# Patient Record
Sex: Male | Born: 1970
Health system: Southern US, Community
[De-identification: ages and names within clinical notes are randomized; demographics above are authoritative.]

## PROBLEM LIST (undated history)

## (undated) DIAGNOSIS — F32A Depression, unspecified: Secondary | ICD-10-CM

## (undated) DIAGNOSIS — F419 Anxiety disorder, unspecified: Secondary | ICD-10-CM

## (undated) DIAGNOSIS — F329 Major depressive disorder, single episode, unspecified: Secondary | ICD-10-CM

## (undated) HISTORY — DX: Anxiety disorder, unspecified: F41.9

## (undated) HISTORY — PX: BACK SURGERY: SHX140

## (undated) HISTORY — DX: Depression, unspecified: F32.A

## (undated) HISTORY — DX: Major depressive disorder, single episode, unspecified: F32.9

---

## 2004-03-10 ENCOUNTER — Emergency Department: Payer: Self-pay | Admitting: Unknown Physician Specialty

## 2004-03-11 ENCOUNTER — Other Ambulatory Visit: Payer: Self-pay

## 2004-04-30 ENCOUNTER — Inpatient Hospital Stay: Payer: Self-pay | Admitting: Internal Medicine

## 2005-04-13 ENCOUNTER — Emergency Department: Payer: Self-pay | Admitting: Emergency Medicine

## 2005-04-22 ENCOUNTER — Other Ambulatory Visit: Payer: Self-pay

## 2005-04-22 ENCOUNTER — Emergency Department: Payer: Self-pay | Admitting: Emergency Medicine

## 2005-04-27 ENCOUNTER — Observation Stay: Payer: Self-pay | Admitting: Internal Medicine

## 2005-04-27 ENCOUNTER — Other Ambulatory Visit: Payer: Self-pay

## 2005-07-29 ENCOUNTER — Ambulatory Visit: Payer: Self-pay

## 2005-10-27 ENCOUNTER — Ambulatory Visit: Payer: Self-pay | Admitting: Pain Medicine

## 2005-11-04 ENCOUNTER — Ambulatory Visit: Payer: Self-pay | Admitting: Pain Medicine

## 2005-11-15 ENCOUNTER — Ambulatory Visit: Payer: Self-pay | Admitting: Pain Medicine

## 2005-11-23 ENCOUNTER — Ambulatory Visit: Payer: Self-pay | Admitting: Pain Medicine

## 2005-12-08 ENCOUNTER — Ambulatory Visit: Payer: Self-pay | Admitting: Physician Assistant

## 2006-08-13 ENCOUNTER — Emergency Department: Payer: Self-pay | Admitting: Emergency Medicine

## 2007-05-15 IMAGING — CT CT HEAD WITHOUT CONTRAST
2 series · 16 of 30 positions shown, 20 images · non-contrast
Comparison: none

REASON FOR EXAM: decreased responsiveness      rm 4
COMMENTS:

[Series 2: without · axial · non-contrast · 0.43mm/px · z∈[-175,-40]mm · 13 of 33 slices shown, 17 images]
[im 3/33  brain]
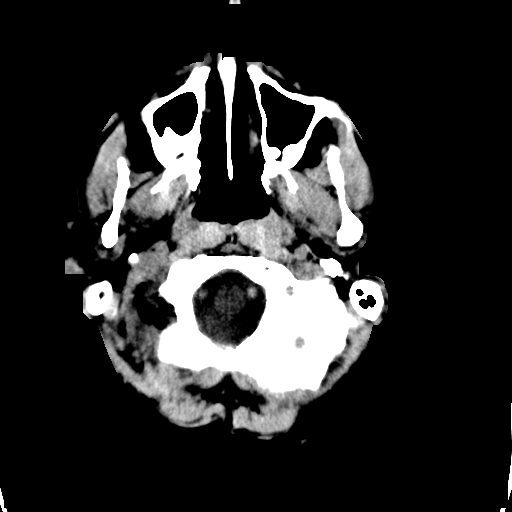
[im 3/33  bone]
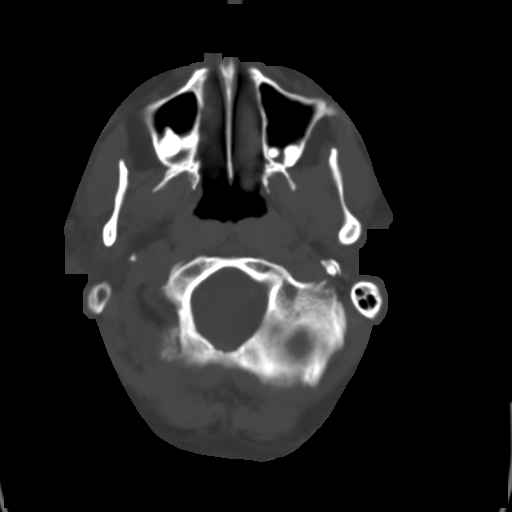
[im 5/33  brain]
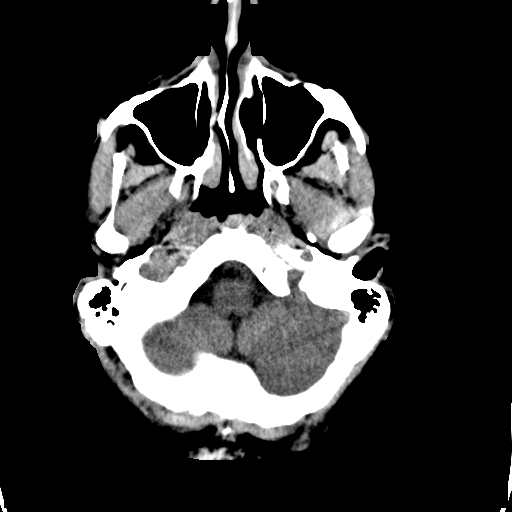
[im 7/33  brain]
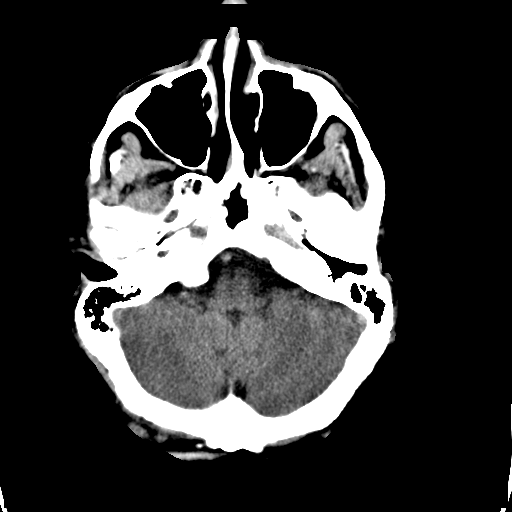
[im 10/33  brain]
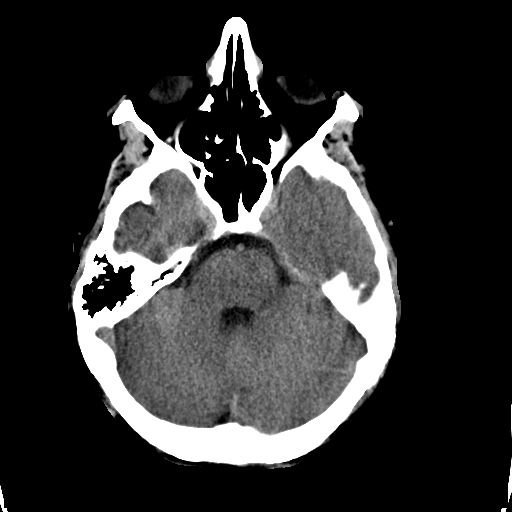
[im 12/33  brain]
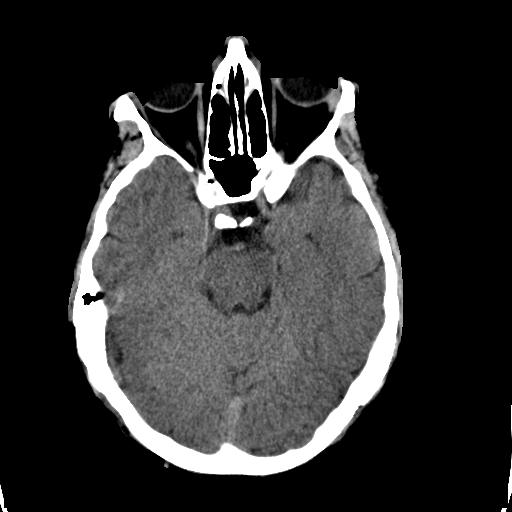
[im 12/33  bone]
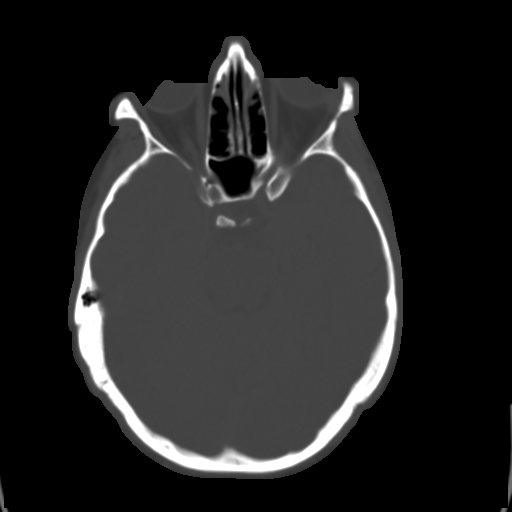
[im 14/33  brain]
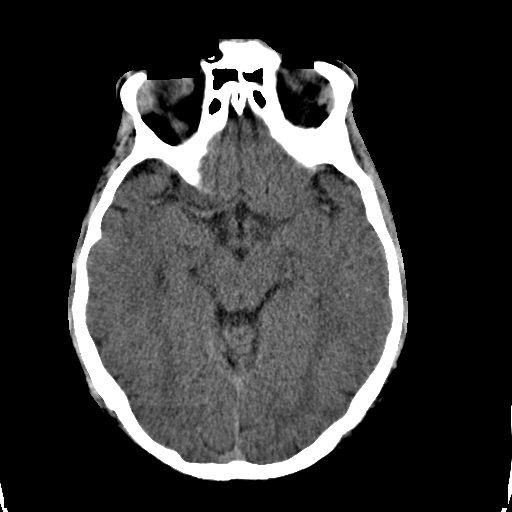
[im 17/33  brain]
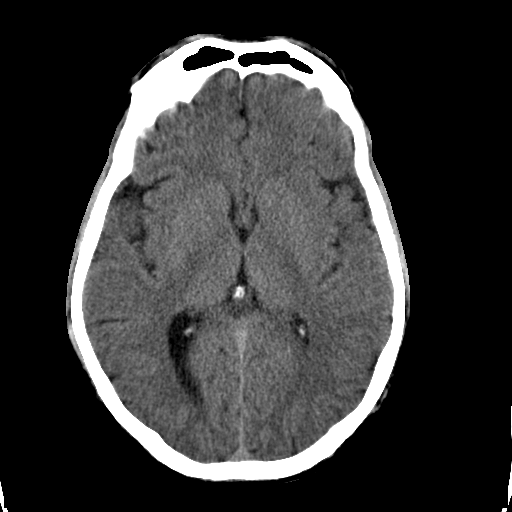
[im 19/33  brain]
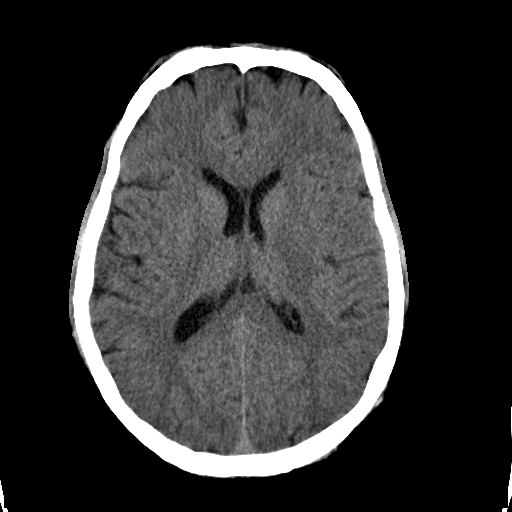
[im 21/33  brain]
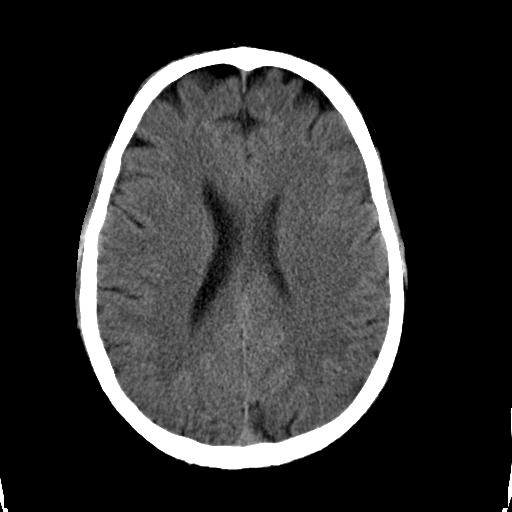
[im 21/33  bone]
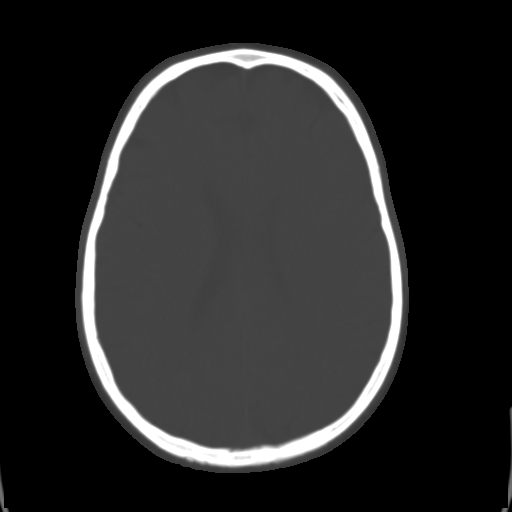
[im 23/33  brain]
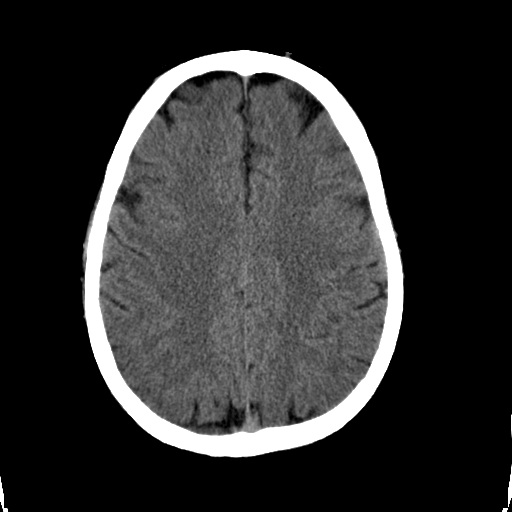
[im 26/33  brain]
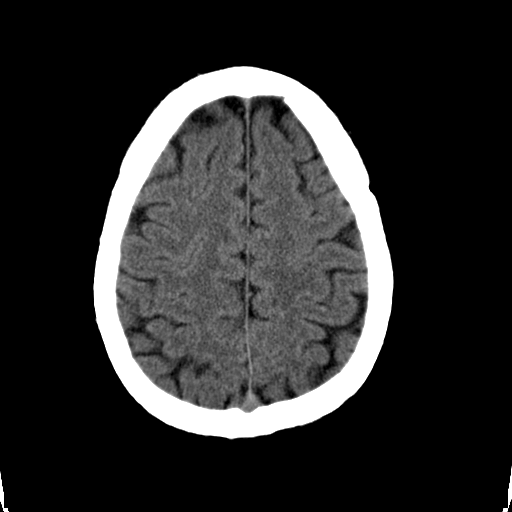
[im 28/33  brain]
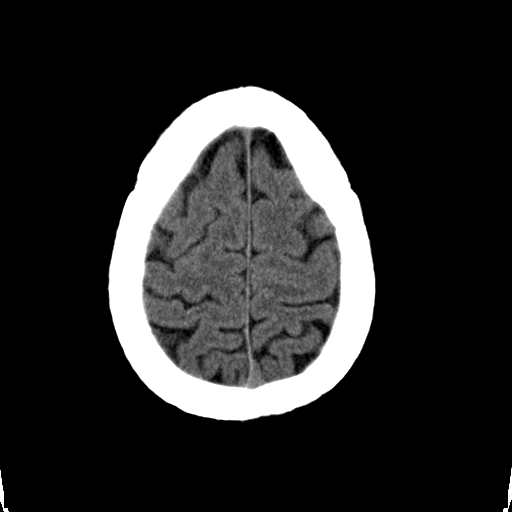
[im 30/33  brain]
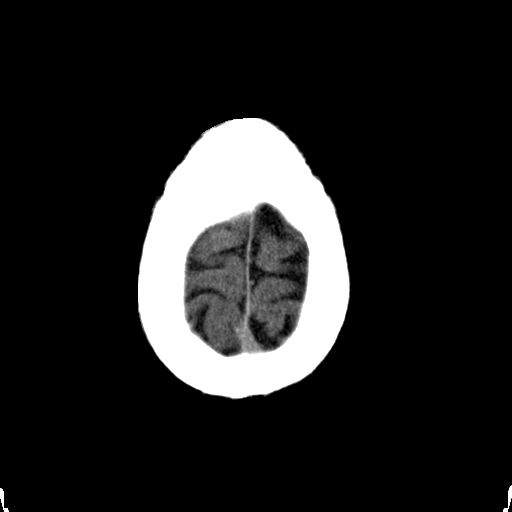
[im 30/33  bone]
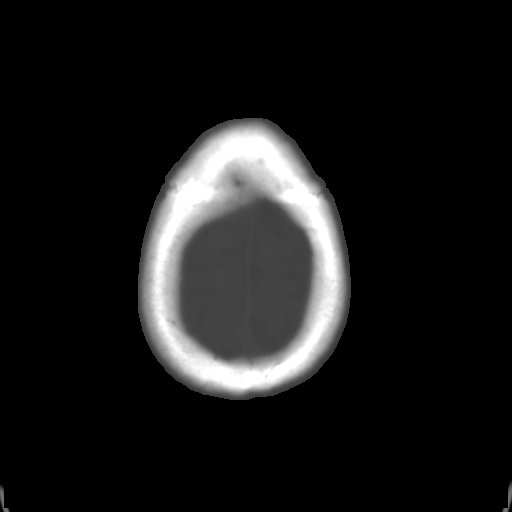

[Series 3: bone · axial · 0.43mm/px · z∈[-175,-130]mm · 3 of 33 slices shown]
[im 3/33  bone]
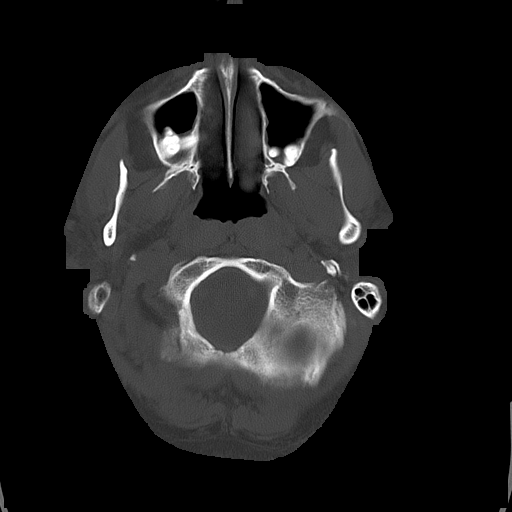
[im 7/33  bone]
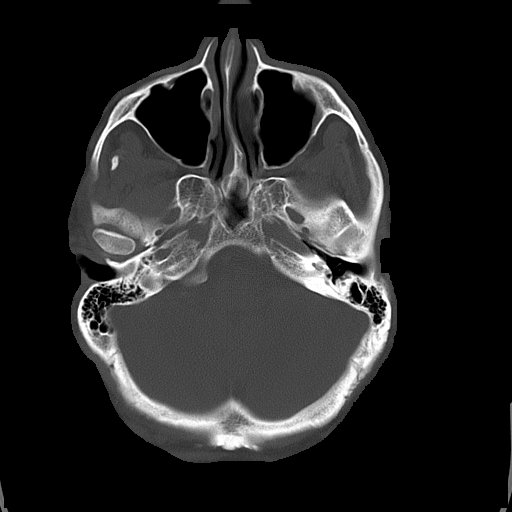
[im 12/33  bone]
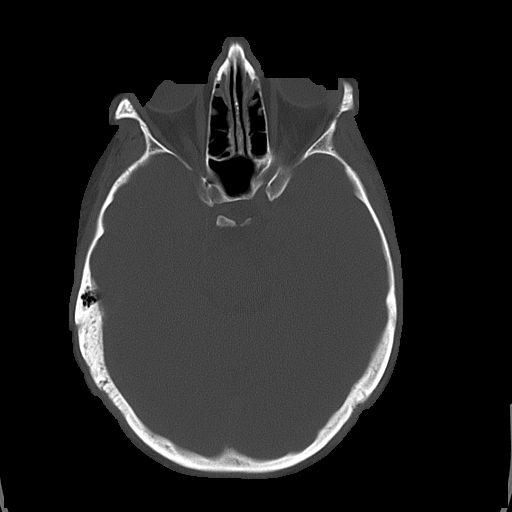

[16 of 30 positions shown; findings below may reference images not displayed]

PROCEDURE:     CT  - CT HEAD WITHOUT CONTRAST  - April 22, 2005 [DATE]

RESULT:     An unenhanced emergent Head CT was performed for decreased
responsiveness.  The report was initially faxed to the Emergency Department.

No intracerebral bleeds or infarcts are noted. No mass effect. No shift of
the midline. The ventricles appear within normal limits.  No extra-axial
fluid collections.  On the bone window settings the sinuses and mastoids
appear clear. There is some mucoperiosteal thickening of the LEFT sphenoid,
which can represent a LEFT sphenoid sinusitis.
IMPRESSION: 1)No acute abnormalities identified on the unenhanced Head CT.  Possible
chronic LEFT sphenoid sinusitis.

## 2007-05-20 IMAGING — US ABDOMEN ULTRASOUND
1 series · 17 of 25 positions shown · non-contrast
Comparison: none

REASON FOR EXAM: Abdominal pain, elevated bilirubin
COMMENTS:

[Series 1: abdomen ultrasound · 17 of 66 slices shown]
[im 1/66]
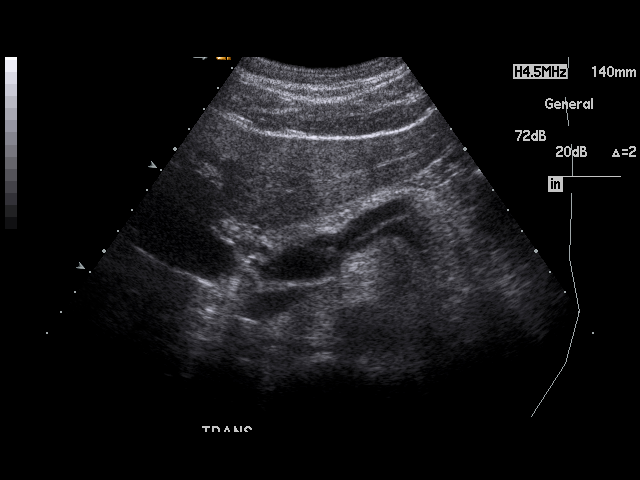
[im 6/66]
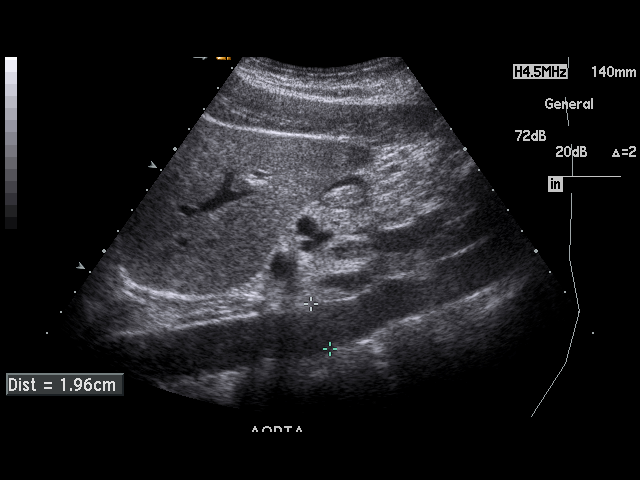
[im 9/66]
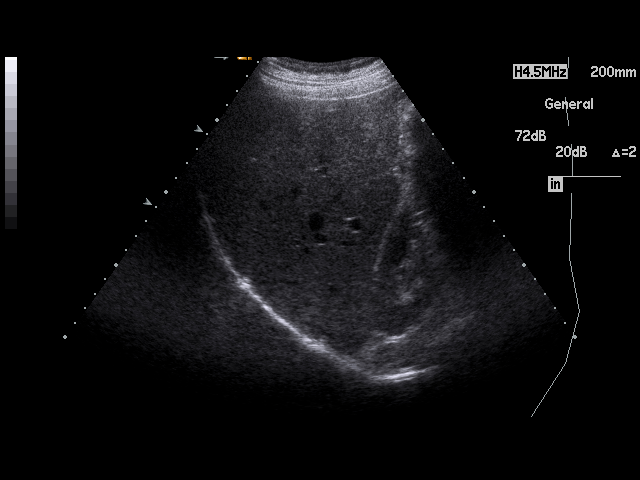
[im 14/66]
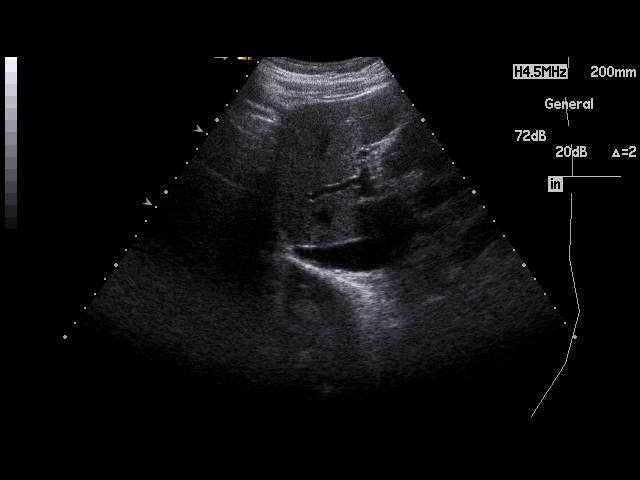
[im 17/66]
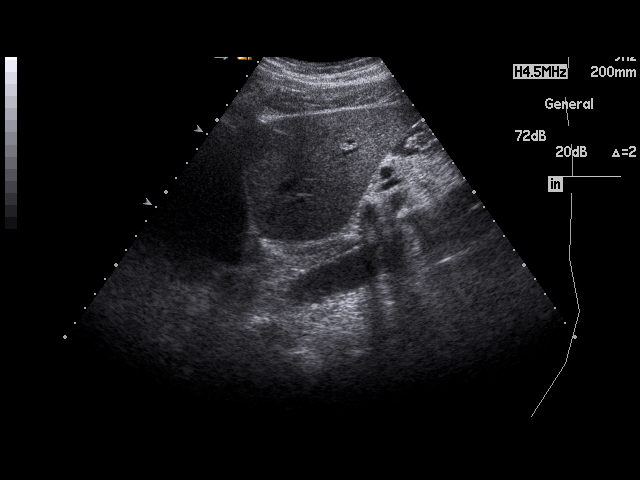
[im 22/66]
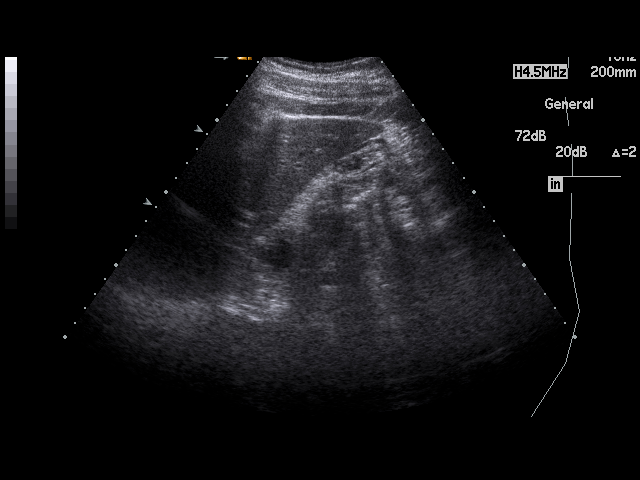
[im 25/66]
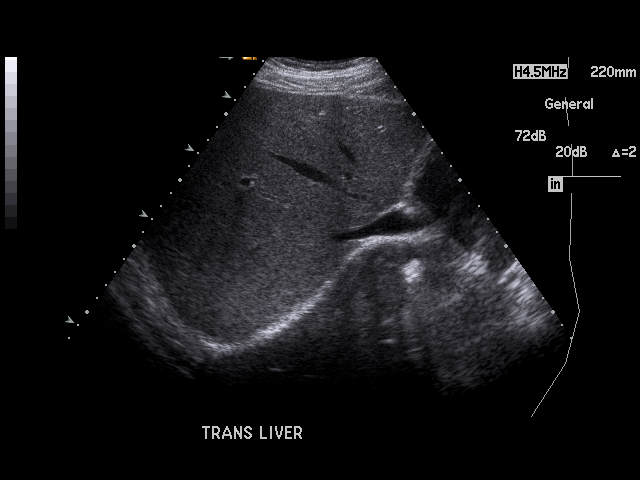
[im 30/66]
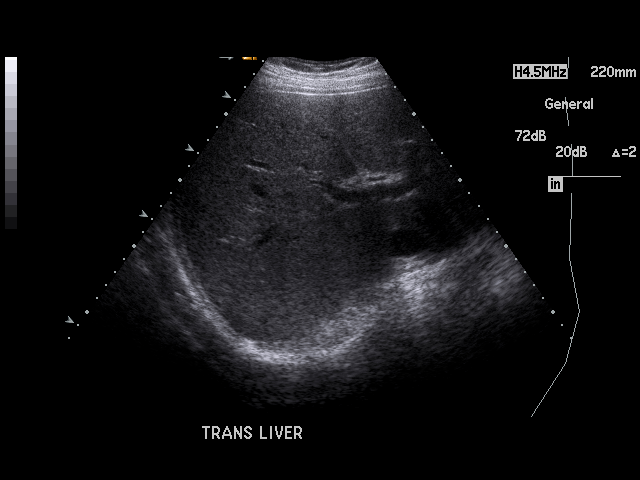
[im 33/66]
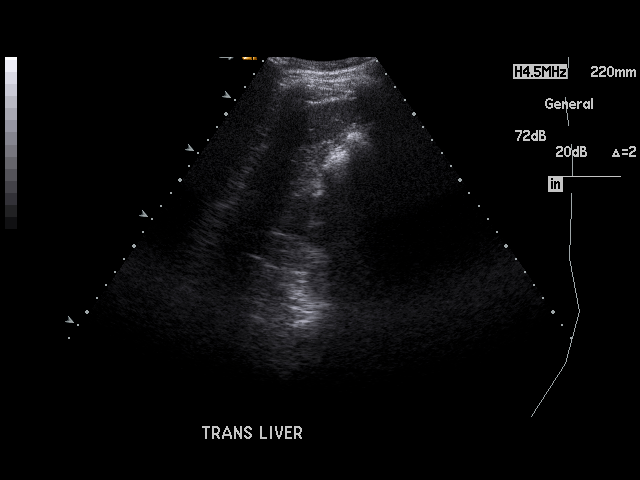
[im 36/66]
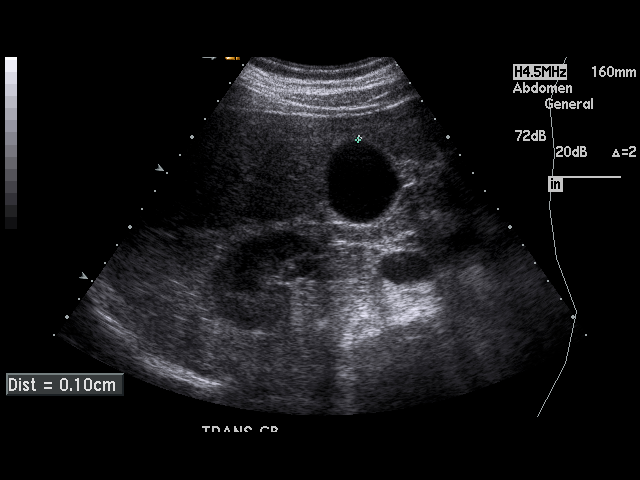
[im 41/66]
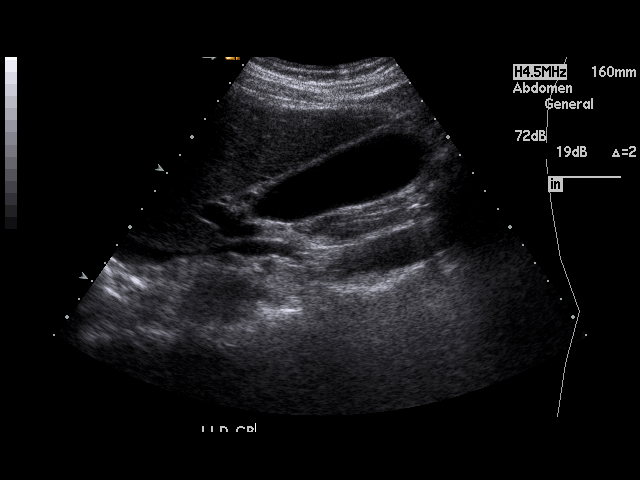
[im 44/66]
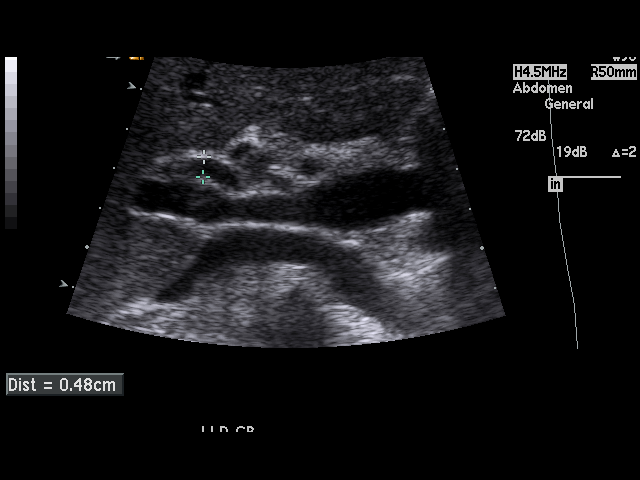
[im 49/66]
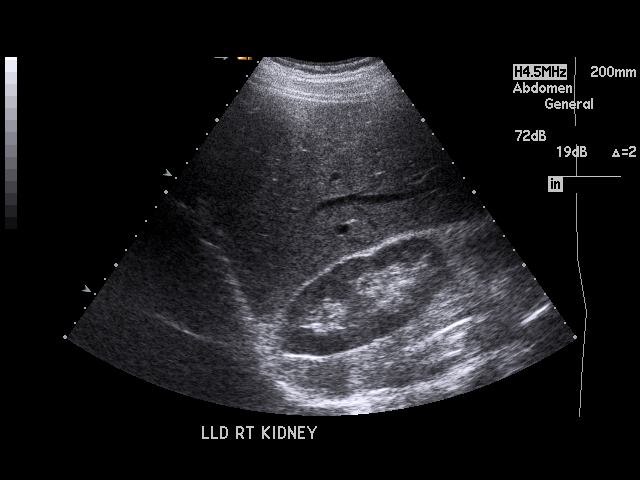
[im 52/66]
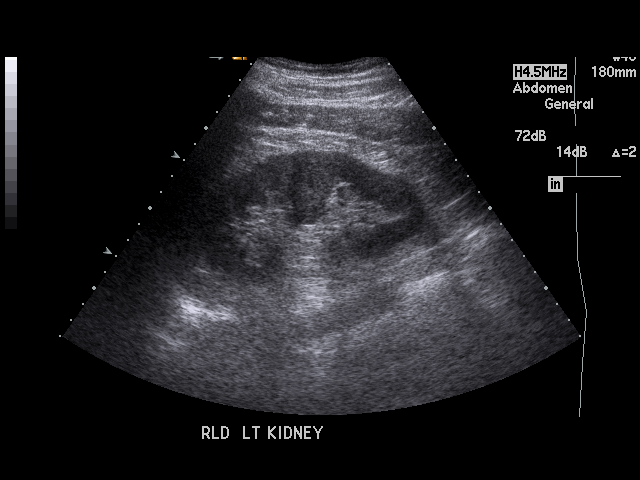
[im 57/66]
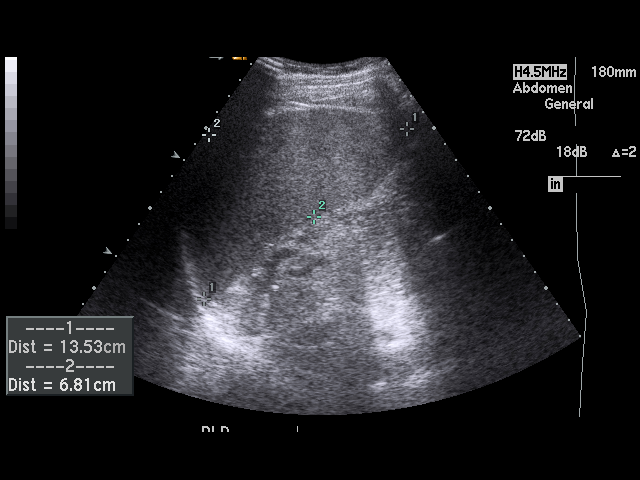
[im 60/66]
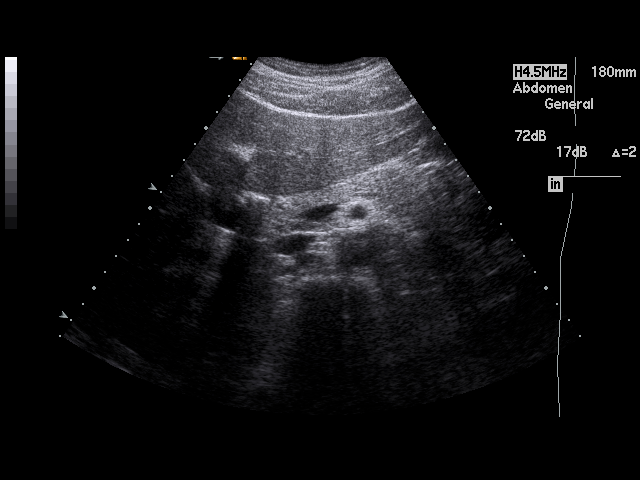
[im 66/66]
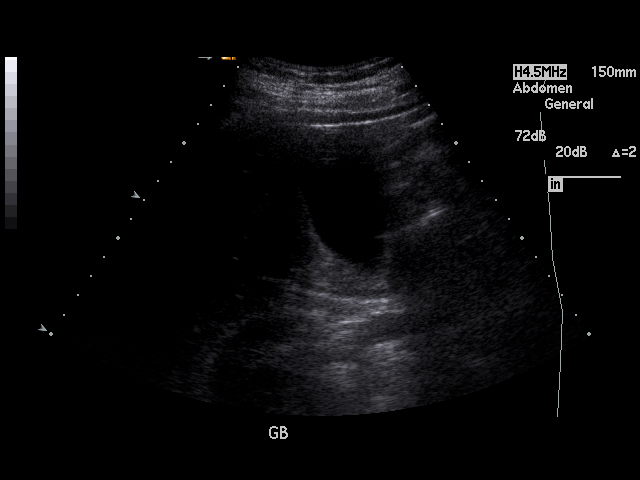

[17 of 25 positions shown; findings below may reference images not displayed]

PROCEDURE:     US  - US ABDOMEN GENERAL SURVEY  - April 27, 2005  [DATE]

RESULT:          Abdominal ultrasound is obtained and reveals a normal
liver, pancreas and gallbladder.  The abdominal aorta is normal.  The spleen
is borderline enlarged at 3.3 cm.  The RIGHT kidney measures 11.7 cm, the
LEFT 12.9.
IMPRESSION: Borderline splenomegaly at 3.3 cm.  Otherwise
unremarkable exam.

## 2007-05-20 IMAGING — CR DG CHEST 1V PORT
1 series · 2 of 2 positions shown · non-contrast
Comparison: none

REASON FOR EXAM: Rales at bases
COMMENTS:

PROCEDURE:     DXR - DXR PORTABLE CHEST SINGLE VIEW  - April 27, 2005  [DATE]
RESULT:     AP view of the chest shows the lung fields to be clear. No
pneumonia, pneumothorax or pleural effusion is seen. The heart size is
normal.

[Series 1: view not recorded · 0.17mm/px · 2 of 2 slices shown]
[im 1/2]
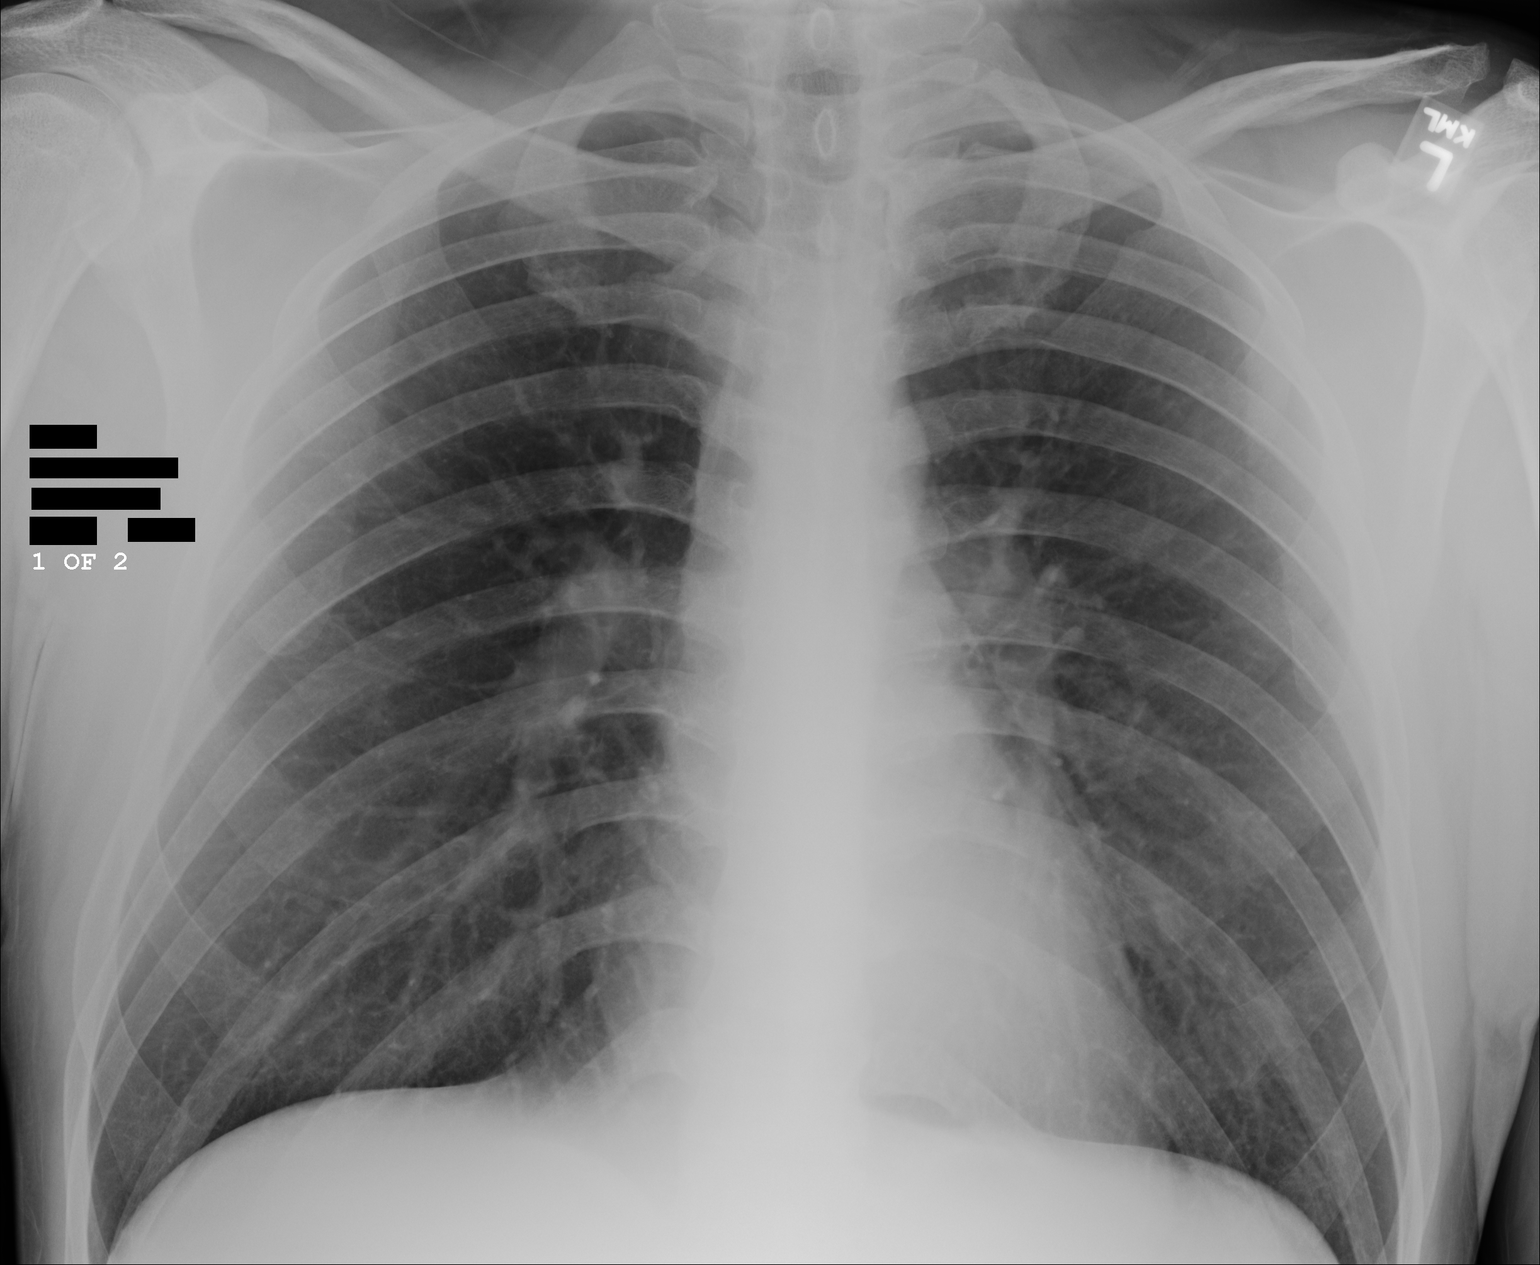
[im 2/2]
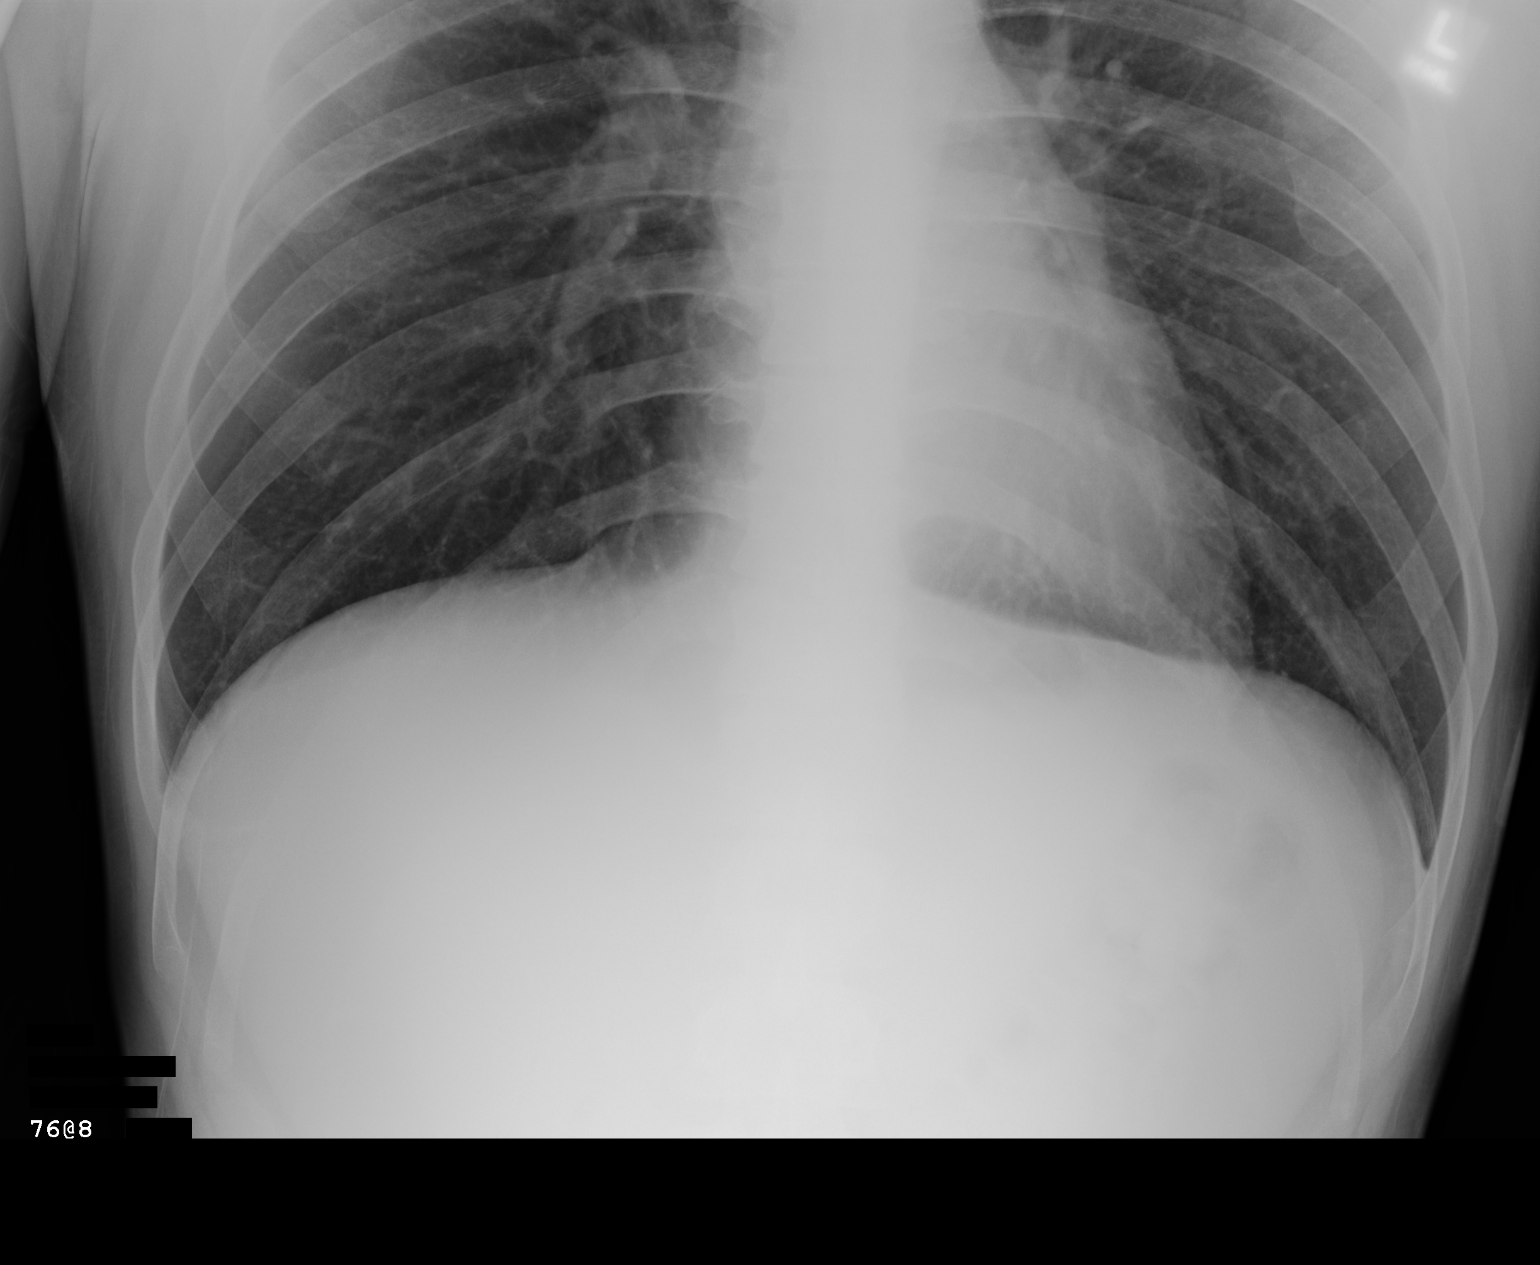

[2 of 2 positions shown; findings below may reference images not displayed]

IMPRESSION: No acute changes are identified.

## 2011-01-18 DIAGNOSIS — F332 Major depressive disorder, recurrent severe without psychotic features: Secondary | ICD-10-CM | POA: Diagnosis not present

## 2011-03-15 DIAGNOSIS — F332 Major depressive disorder, recurrent severe without psychotic features: Secondary | ICD-10-CM | POA: Diagnosis not present

## 2011-07-22 ENCOUNTER — Emergency Department: Payer: Self-pay | Admitting: Emergency Medicine

## 2011-07-22 DIAGNOSIS — D485 Neoplasm of uncertain behavior of skin: Secondary | ICD-10-CM | POA: Diagnosis not present

## 2011-07-22 DIAGNOSIS — D2370 Other benign neoplasm of skin of unspecified lower limb, including hip: Secondary | ICD-10-CM | POA: Diagnosis not present

## 2011-08-30 DIAGNOSIS — F332 Major depressive disorder, recurrent severe without psychotic features: Secondary | ICD-10-CM | POA: Diagnosis not present

## 2011-10-12 DIAGNOSIS — Z23 Encounter for immunization: Secondary | ICD-10-CM | POA: Diagnosis not present

## 2012-01-03 DIAGNOSIS — F332 Major depressive disorder, recurrent severe without psychotic features: Secondary | ICD-10-CM | POA: Diagnosis not present

## 2012-07-03 DIAGNOSIS — F332 Major depressive disorder, recurrent severe without psychotic features: Secondary | ICD-10-CM | POA: Diagnosis not present

## 2012-10-25 DIAGNOSIS — Z23 Encounter for immunization: Secondary | ICD-10-CM | POA: Diagnosis not present

## 2012-12-26 DIAGNOSIS — F332 Major depressive disorder, recurrent severe without psychotic features: Secondary | ICD-10-CM | POA: Diagnosis not present

## 2013-07-26 DIAGNOSIS — F332 Major depressive disorder, recurrent severe without psychotic features: Secondary | ICD-10-CM | POA: Diagnosis not present

## 2013-10-29 DIAGNOSIS — Z23 Encounter for immunization: Secondary | ICD-10-CM | POA: Diagnosis not present

## 2014-05-16 DIAGNOSIS — F332 Major depressive disorder, recurrent severe without psychotic features: Secondary | ICD-10-CM | POA: Insufficient documentation

## 2014-07-23 ENCOUNTER — Encounter: Payer: Self-pay | Admitting: Psychiatry

## 2014-07-23 ENCOUNTER — Ambulatory Visit (INDEPENDENT_AMBULATORY_CARE_PROVIDER_SITE_OTHER): Payer: Medicare Other | Admitting: Psychiatry

## 2014-07-23 VITALS — BP 122/82 | HR 83 | Temp 98.1°F | Ht 74.0 in | Wt 206.6 lb

## 2014-07-23 DIAGNOSIS — F333 Major depressive disorder, recurrent, severe with psychotic symptoms: Secondary | ICD-10-CM

## 2014-07-23 DIAGNOSIS — E7800 Pure hypercholesterolemia, unspecified: Secondary | ICD-10-CM

## 2014-07-23 MED ORDER — OLANZAPINE 15 MG PO TABS: 30.0000 mg | ORAL_TABLET | Freq: Every day | ORAL | Status: DC

## 2014-07-23 MED ORDER — LAMOTRIGINE 100 MG PO TABS: 100.0000 mg | ORAL_TABLET | Freq: Two times a day (BID) | ORAL | Status: DC

## 2014-07-23 NOTE — Progress Notes (Signed)
[facility] MD/PA/NP OP Progress Note  07/23/2014 3:53 PM Rodney Morrison  MRN:  [id number]  Subjective:  "I'm doing well now" Chief Complaint:  history of severe depression. Currently mood and anxiety level are good. No new complaints Visit Diagnosis:     ICD-9-CM ICD-10-CM   1. Elevated cholesterol 272.0 E78.0 Lipid panel  2. Severe recurrent major depression with psychotic features 296.34 F33.3     Past Medical History:  Past Medical History  Diagnosis Date  . Anxiety   . Depression     Past Surgical History  Procedure Laterality Date  . Back surgery     Family History: History reviewed. No pertinent family history. Social History:  History   Social History  . Marital Status: Single    Spouse Name: N/A  . Number of Children: N/A  . Years of Education: N/A   Social History Main Topics  . Smoking status: Current Every Day Smoker -- 1.00 packs/day    Types: Cigarettes    Start date: 07/22/1984  . Smokeless tobacco: Never Used  . Alcohol Use: 0.6 oz/week    0 Standard drinks or equivalent, 1 Cans of beer per week  . Drug Use: No  . Sexual Activity: Not Currently   Other Topics Concern  . None   Social History Narrative  . None   Additional History: Since our last visit he had an episode of once again injuring his leg which caused him to be out of commission for several weeks. During this time he got depressed and anxious but did not have any suicidal thoughts. He has continued his medicine without interruption and has no side effects from it. Mood is good. No psychotic symptoms. No suicidal ideation.  Assessment: Recurrent depression with psychotic features currently stable on medication Musculoskeletal: Strength & Muscle Tone: within normal limits Gait & Station: normal Patient leans: N/A  Psychiatric Specialty Exam: HPI  ROS  Blood pressure 122/82, pulse 83, temperature 98.1 F (36.7 C), temperature source Tympanic, height 6\' 2"  (1.88 m), weight 206 lb 9.6 oz (93.713  kg), SpO2 96 %.Body mass index is 26.51 kg/(m^2).  General Appearance: Casual  Eye Contact:  Good  Speech:  Clear and Coherent  Volume:  Normal  Mood:  Euthymic  Affect:  Appropriate  Thought Process:  Coherent  Orientation:  Full (Time, Place, and Person)  Thought Content:  Negative  Suicidal Thoughts:  No  Homicidal Thoughts:  No  Memory:  Immediate;   Good Recent;   Good Remote;   Good  Judgement:  Good  Insight:  Fair  Psychomotor Activity:  Normal  Concentration:  Good  Recall:  Good  Fund of Knowledge: Good  Language: Good  Akathisia:  No  Handed:  Right  AIMS (if indicated):     Assets:  Communication Skills Desire for Improvement Financial Resources/Insurance Resilience Social Support  ADL's:  Intact  Cognition: WNL  Sleep:  Good    Is the patient at risk to self?  No. Has the patient been a risk to self in the past 6 months?  No. Has the patient been a risk to self within the distant past?  No. Is the patient a risk to others?  No. Has the patient been a risk to others in the past 6 months?  No. Has the patient been a risk to others within the distant past?  No.  Current Medications: Current Outpatient Prescriptions  Medication Sig Dispense Refill  . lamoTRIgine (LAMICTAL) 100 MG tablet Take 1  tablet (100 mg total) by mouth 2 (two) times daily. 60 tablet 5  . OLANZapine (ZYPREXA) 15 MG tablet Take 2 tablets (30 mg total) by mouth at bedtime. 60 tablet 5   No current facility-administered medications for this visit.    Medical Decision Making:  Established Problem, Stable/Improving (1), Review of Psycho-Social Stressors (1), Review and summation of old records (2), Review of Medication Regimen & Side Effects (2) and Review of New Medication or Change in Dosage (2)  Treatment Plan Summary:Medication management and Plan Continue Zyprexa and lamotrigine he did not get his lipid panel done last time. We discussed the rationale for this and the importance of  completing it. He agrees to the plan and will get his lipid panel checked and I have resubmitted the order to [facility]. Continue lamotrigine and Zyprexa. Follow-up 6 months. Supportive counseling and education completed   Alethia Berthold 07/23/2014, 3:53 PM

## 2014-11-15 DIAGNOSIS — Z23 Encounter for immunization: Secondary | ICD-10-CM | POA: Diagnosis not present

## 2015-01-23 ENCOUNTER — Encounter: Payer: Self-pay | Admitting: Psychiatry

## 2015-01-23 ENCOUNTER — Ambulatory Visit (INDEPENDENT_AMBULATORY_CARE_PROVIDER_SITE_OTHER): Payer: Medicare Other | Admitting: Psychiatry

## 2015-01-23 VITALS — BP 138/90 | HR 94 | Temp 98.3°F | Ht 74.0 in | Wt 210.8 lb

## 2015-01-23 DIAGNOSIS — F333 Major depressive disorder, recurrent, severe with psychotic symptoms: Secondary | ICD-10-CM

## 2015-01-23 MED ORDER — OLANZAPINE 15 MG PO TABS: 30.0000 mg | ORAL_TABLET | Freq: Every day | ORAL | Status: DC

## 2015-01-23 MED ORDER — LAMOTRIGINE 100 MG PO TABS: 100.0000 mg | ORAL_TABLET | Freq: Two times a day (BID) | ORAL | Status: DC

## 2015-03-14 NOTE — Progress Notes (Signed)
[facility] MD Progress Note  03/14/2015 4:57 PM Rodney Morrison  MRN:  [id number] Subjective:  Follow-up for 45 year old man with a history of severe psychotic depression. He is stable as long as he takes his antipsychotic and mood stabilizer. He has had some rough times recently and some stress in his family but is feeling better. Never got into a bad depression or had psychotic symptoms. Feeling physically better now. Tolerating medicine well. Optimistic about the future. Principal Problem: @PPROB @ Diagnosis:   Patient Active Problem List   Diagnosis Date Noted  . Major depressive disorder, recurrent severe without psychotic features ([place]) [F33.2] 05/16/2014   Total Time spent with patient: 20 minutes  Past Psychiatric History: Past history of severe depression with psychotic symptoms especially when off medicine very sensitive.  Past Medical History:  Past Medical History  Diagnosis Date  . Anxiety   . Depression     Past Surgical History  Procedure Laterality Date  . Back surgery     Family History: History reviewed. No pertinent family history. Family Psychiatric  History: Positive for anxiety and depression Social History:  History  Alcohol Use  . 0.6 oz/week  . 0 Standard drinks or equivalent, 1 Cans of beer per week     History  Drug Use No    Social History   Social History  . Marital Status: Single    Spouse Name: N/A  . Number of Children: N/A  . Years of Education: N/A   Social History Main Topics  . Smoking status: Current Every Day Smoker -- 1.00 packs/day    Types: Cigarettes    Start date: 07/22/1984  . Smokeless tobacco: Never Used  . Alcohol Use: 0.6 oz/week    0 Standard drinks or equivalent, 1 Cans of beer per week  . Drug Use: No  . Sexual Activity: Not Currently   Other Topics Concern  . None   Social History Narrative   Additional Social History:                         Sleep: Fair  Appetite:  Fair  Current Medications: Current  Outpatient Prescriptions  Medication Sig Dispense Refill  . lamoTRIgine (LAMICTAL) 100 MG tablet Take 1 tablet (100 mg total) by mouth 2 (two) times daily. 60 tablet 5  . OLANZapine (ZYPREXA) 15 MG tablet Take 2 tablets (30 mg total) by mouth at bedtime. 60 tablet 5   No current facility-administered medications for this visit.    Lab Results: No results found for this or any previous visit (from the past 48 hour(s)).  Blood Alcohol level:  No results found for: [facility]  Physical Findings: AIMS:  , ,  ,  ,    CIWA:    COWS:     Musculoskeletal: Strength & Muscle Tone: within normal limits Gait & Station: normal Patient leans: N/A  Psychiatric Specialty Exam: ROS  Blood pressure 138/90, pulse 94, temperature 98.3 F (36.8 C), temperature source Tympanic, height 6\' 2"  (1.88 m), weight 210 lb 12.8 oz (95.618 kg), SpO2 96 %.Body mass index is 27.05 kg/(m^2).  General Appearance: Fairly Groomed  Engineer, water::  Good  Speech:  Clear and Coherent  Volume:  Normal  Mood:  Euthymic  Affect:  Congruent  Thought Process:  Intact  Orientation:  Full (Time, Place, and Person)  Thought Content:  Negative  Suicidal Thoughts:  No  Homicidal Thoughts:  No  Memory:  Immediate;   Good Recent;  Good Remote;   Good  Judgement:  Good  Insight:  Good  Psychomotor Activity:  Normal  Concentration:  Good  Recall:  Good  Fund of Knowledge:Good  Language: Good  Akathisia:  No  Handed:  Right  AIMS (if indicated):     Assets:  Desire for Improvement Financial Resources/Insurance Resilience Social Support  ADL's:  Intact  Cognition: WNL  Sleep:      Treatment Plan Summary: Medication management and Plan Continue lamotrigine and Zyprexa. Side effects and risks reviewed. Patient agreeable to continuing medicine. Orders done for 6 months. Patient will follow-up at that time he agrees to the plan.  Rodney Berthold, MD 03/14/2015, 4:57 PM

## 2015-07-22 ENCOUNTER — Ambulatory Visit (INDEPENDENT_AMBULATORY_CARE_PROVIDER_SITE_OTHER): Payer: Medicare Other | Admitting: Psychiatry

## 2015-07-22 DIAGNOSIS — F333 Major depressive disorder, recurrent, severe with psychotic symptoms: Secondary | ICD-10-CM

## 2015-07-22 DIAGNOSIS — G4700 Insomnia, unspecified: Secondary | ICD-10-CM | POA: Diagnosis not present

## 2015-07-22 MED ORDER — OLANZAPINE 15 MG PO TABS: 30.0000 mg | ORAL_TABLET | Freq: Every day | ORAL | Status: DC

## 2015-07-22 MED ORDER — LAMOTRIGINE 100 MG PO TABS: 100.0000 mg | ORAL_TABLET | Freq: Two times a day (BID) | ORAL | Status: DC

## 2015-07-22 MED ORDER — TEMAZEPAM 15 MG PO CAPS: 15.0000 mg | ORAL_CAPSULE | Freq: Every evening | ORAL | Status: DC | PRN

## 2015-07-22 NOTE — Progress Notes (Signed)
Patient ID: Rodney Morrison, male   DOB: 06-06-1970, 45 y.o.   MRN: [id number] Follow-up patient with severe recurrent depression with psychotic features. Mood has been stable. No return of any depression. No suicidal thoughts or behavior. No return of any hallucinations or psychotic symptoms. He does have poor sleep with nightmares about 3 times a week.  On full review of systems he has trouble with sleep. No other new specific physical complaints. No mood complaints no psychotic symptoms.  On mental status he is neatly dressed, alert and oriented, appropriate in his interactions. Affect is euthymic and reactive. Mood is good. Thoughts lucid without loosening of associations or delusions.  Tolerating Lamictal 100 mg twice a day and olanzapine 30 mg at night. Labs of been stable.  Add Restoril 15 mg at night as needed only for sleep. Suggest he uses it only if he wakes up from nightmares. Follow-up in 6 months. He had patient agrees to plan. Potential for dependence reviewed.

## 2015-12-01 DIAGNOSIS — Z23 Encounter for immunization: Secondary | ICD-10-CM | POA: Diagnosis not present

## 2016-01-19 ENCOUNTER — Telehealth: Payer: Self-pay | Admitting: Psychiatry

## 2016-01-19 ENCOUNTER — Ambulatory Visit (INDEPENDENT_AMBULATORY_CARE_PROVIDER_SITE_OTHER): Payer: Medicare Other | Admitting: Psychiatry

## 2016-01-19 ENCOUNTER — Encounter: Payer: Self-pay | Admitting: Psychiatry

## 2016-01-19 VITALS — BP 148/81 | HR 96 | Ht 74.0 in | Wt 224.4 lb

## 2016-01-19 DIAGNOSIS — F333 Major depressive disorder, recurrent, severe with psychotic symptoms: Secondary | ICD-10-CM | POA: Diagnosis not present

## 2016-01-19 MED ORDER — LAMOTRIGINE 100 MG PO TABS: 100.0000 mg | ORAL_TABLET | Freq: Two times a day (BID) | ORAL | 5 refills | Status: DC

## 2016-01-19 MED ORDER — OLANZAPINE 15 MG PO TABS: 30.0000 mg | ORAL_TABLET | Freq: Every day | ORAL | 5 refills | Status: DC

## 2016-01-19 NOTE — Telephone Encounter (Signed)
Noted  

## 2016-01-19 NOTE — Progress Notes (Signed)
Follow up for patient with severe recurrent depression with psychosis. No new complaints. Mood has been good. No return of psychosis. Has a stress fracture of his foot. Functioning well however in his family role. No suicidal ideation. Affect calm. Behavior appropriate. Medicine renewed. Follow-up 6 months.

## 2016-01-20 ENCOUNTER — Ambulatory Visit: Payer: Medicare Other | Admitting: Psychiatry

## 2016-02-12 ENCOUNTER — Other Ambulatory Visit: Payer: Self-pay | Admitting: Psychiatry

## 2016-02-12 MED ORDER — TEMAZEPAM 15 MG PO CAPS: 15.0000 mg | ORAL_CAPSULE | Freq: Every evening | ORAL | 5 refills | Status: DC | PRN

## 2016-02-12 NOTE — Progress Notes (Signed)
refill 

## 2016-02-12 NOTE — Telephone Encounter (Signed)
done

## 2016-05-31 ENCOUNTER — Ambulatory Visit (INDEPENDENT_AMBULATORY_CARE_PROVIDER_SITE_OTHER): Payer: Medicare Other | Admitting: Psychiatry

## 2016-05-31 ENCOUNTER — Encounter: Payer: Self-pay | Admitting: Psychiatry

## 2016-05-31 VITALS — BP 126/81 | HR 78 | Temp 98.0°F | Wt 226.8 lb

## 2016-05-31 DIAGNOSIS — F333 Major depressive disorder, recurrent, severe with psychotic symptoms: Secondary | ICD-10-CM | POA: Diagnosis not present

## 2016-05-31 DIAGNOSIS — F17213 Nicotine dependence, cigarettes, with withdrawal: Secondary | ICD-10-CM

## 2016-05-31 MED ORDER — VARENICLINE TARTRATE 0.5 MG PO TABS: 0.5000 mg | ORAL_TABLET | Freq: Two times a day (BID) | ORAL | 1 refills | Status: DC

## 2016-05-31 NOTE — Progress Notes (Signed)
46 year old man with a history of recurrent depression who comes in today primarily wanting help with stopping smoking. Somewhat to my surprise he tells me that in the time since I last saw him he had been drinking and using cocaine as well but has managed to stop those. Now he wants to stop smoking but finds that he can't do it. He is smoking about 2 packs a day. Claims that his mood is fine and that he is sleeping okay. Denies any psychotic symptoms. Denies suicidal or homicidal thoughts.  Neatly dressed man looks his stated age cooperative and pleasant anxious affect. Thoughts lucid no sign of loosening of associations or delusions.  We discussed the risks and benefits of using Chantix but the patient is desperate to quit smoking. We discussed a plan where he will try to cut down to a pack a day over the next week meanwhile starting Chantix on the taper up dose. Order written for the prescription and I will see him back in a month. Patient knows there is a risk of depression and will notify me if things are worse.

## 2016-06-10 ENCOUNTER — Encounter: Payer: Self-pay | Admitting: Psychiatry

## 2016-06-10 ENCOUNTER — Ambulatory Visit (INDEPENDENT_AMBULATORY_CARE_PROVIDER_SITE_OTHER): Payer: Medicare Other | Admitting: Psychiatry

## 2016-06-10 VITALS — BP 136/78 | HR 86 | Temp 97.7°F | Wt 224.8 lb

## 2016-06-10 DIAGNOSIS — F333 Major depressive disorder, recurrent, severe with psychotic symptoms: Secondary | ICD-10-CM

## 2016-06-10 DIAGNOSIS — F17213 Nicotine dependence, cigarettes, with withdrawal: Secondary | ICD-10-CM

## 2016-06-10 MED ORDER — BUPROPION HCL ER (XL) 150 MG PO TB24: 150.0000 mg | ORAL_TABLET | Freq: Two times a day (BID) | ORAL | 1 refills | Status: DC

## 2016-06-10 NOTE — Progress Notes (Signed)
Follow-up patient with chronic severe recurrent depression but who has recently come in to see me because of concerns about his smoking. The Chantix did not work out. He took it for several days and thought it was making him feel much worse. He felt like his depression was starting to come back. He got frightened and discontinued the medicine. Meanwhile he has managed cut himself down to about a pack a day. Right now he says his mood is back to feeling normal no depression no psychotic symptoms.  Casually dressed gentleman appropriate interaction good eye contact normal affect thoughts lucid. No evidence suicidality or dangerousness. Discontinue the Chantix. Patient would like to try Wellbutrin. I'm fine with that and think it probably will be better tolerated. He will be given well be trend extended release 150 mg 60 pills with directions to take 1 a day for 3 days and then increase to 2 per day. He agrees to the plan. He has a 2 month supply and will come back to see me for the regularly scheduled appointment in July or call sooner.

## 2016-06-17 ENCOUNTER — Encounter: Payer: Self-pay | Admitting: Psychiatry

## 2016-06-17 ENCOUNTER — Ambulatory Visit (INDEPENDENT_AMBULATORY_CARE_PROVIDER_SITE_OTHER): Payer: Medicare Other | Admitting: Psychiatry

## 2016-06-17 ENCOUNTER — Ambulatory Visit: Payer: Medicare Other | Admitting: Psychiatry

## 2016-06-17 VITALS — BP 134/84 | HR 84 | Temp 97.5°F | Wt 219.4 lb

## 2016-06-17 DIAGNOSIS — F17213 Nicotine dependence, cigarettes, with withdrawal: Secondary | ICD-10-CM | POA: Diagnosis not present

## 2016-06-17 DIAGNOSIS — F333 Major depressive disorder, recurrent, severe with psychotic symptoms: Secondary | ICD-10-CM

## 2016-06-17 MED ORDER — LITHIUM CARBONATE ER 300 MG PO TBCR: 300.0000 mg | EXTENDED_RELEASE_TABLET | Freq: Two times a day (BID) | ORAL | 0 refills | Status: DC

## 2016-06-17 NOTE — Progress Notes (Signed)
Follow-up for 46 year old man with a history of recurrent severe depression. He comes back this time with his mood much worse. He took the Wellbutrin for a few days as part of his attempt to quit smoking. Somewhere along in their he started to feel like his "meds not working". His mood got much more depressed. His energy level is low. He seems anxious nervous and a little disordered although he totally denies any hallucinations and denies any suicidal thoughts. She denies that he has used any other drugs. Says he still smoking but is cutting down. He is still taking his lamotrigine and olanzapine.  Neatly dressed and groomed. Eye contact is less than usual. Seems to be uncomfortable and fidgety. Thoughts are generally lucid no sign of delusions. No suicidal or homicidal ideation. Mood is described as being bad nervous and feeling down.  Patient and I talked about how he has been through this sort of thing in the past when he feels that his "meds aren't working". We discussed the various options for treating this. He firmly rejects the idea of either increasing his medicine or completely switching his medicine and prefers some kind of addition. I suggest lithium given his history of mood instability rapid changes in his mood nervousness and agitation and possible bipolar disorder. Patient agrees to the plan. Side effects discussed. Ordered prescription for lithium 300 mg twice a day 60 pills no refills. Patient is to return to see me in 2 weeks or call sooner if needed. He agrees.

## 2016-06-24 ENCOUNTER — Encounter: Payer: Self-pay | Admitting: Psychiatry

## 2016-06-24 ENCOUNTER — Ambulatory Visit (INDEPENDENT_AMBULATORY_CARE_PROVIDER_SITE_OTHER): Payer: Medicare Other | Admitting: Psychiatry

## 2016-06-24 VITALS — BP 125/73 | HR 58 | Temp 97.5°F | Wt 222.8 lb

## 2016-06-24 DIAGNOSIS — F333 Major depressive disorder, recurrent, severe with psychotic symptoms: Secondary | ICD-10-CM | POA: Diagnosis not present

## 2016-06-24 NOTE — Progress Notes (Signed)
Patient seen for follow-up today. He says that he is not feeling any better. On the other hand it turns out that he did not take his medicine the way I told him to. He had started taking the lithium but discontinued his Lamictal and Zyprexa. He complains that he was not able to sleep as a result. I pointed out that this is almost certainly because he has been taking a high dose of Zyprexa for years and of course he is not going to be able to sleep without it. I explained that I wanted him to take the lithium in addition to the Lamictal and Zyprexa. Patient is neatly dressed and groomed. Affect dysphoric. Denies acute suicidal intent or plan. Seems exasperated and anxious.  Cooperative with treatment. Intermittent eye contact. Dysphoric affect also anxious. No complaints of psychotic symptoms or hallucinations. No acute suicidal thoughts.  Patient eventually agreed to do what I had asked him to do. He was trying to get me to give him yet another medicine but I finally convinced him that he needed to at least try what we had agreed on last week. I will see him back next week at his request. He knows he can get in touch sooner if needed.

## 2016-07-01 ENCOUNTER — Encounter: Payer: Self-pay | Admitting: Psychiatry

## 2016-07-01 ENCOUNTER — Ambulatory Visit (INDEPENDENT_AMBULATORY_CARE_PROVIDER_SITE_OTHER): Payer: Medicare Other | Admitting: Psychiatry

## 2016-07-01 VITALS — BP 142/73 | HR 77 | Temp 97.9°F | Wt 224.6 lb

## 2016-07-01 DIAGNOSIS — F333 Major depressive disorder, recurrent, severe with psychotic symptoms: Secondary | ICD-10-CM | POA: Diagnosis not present

## 2016-07-01 MED ORDER — LITHIUM CARBONATE ER 300 MG PO TBCR: 300.0000 mg | EXTENDED_RELEASE_TABLET | Freq: Two times a day (BID) | ORAL | 3 refills | Status: DC

## 2016-07-01 MED ORDER — TEMAZEPAM 15 MG PO CAPS: 15.0000 mg | ORAL_CAPSULE | Freq: Every evening | ORAL | 5 refills | Status: DC | PRN

## 2016-07-01 MED ORDER — OLANZAPINE 15 MG PO TABS: 30.0000 mg | ORAL_TABLET | Freq: Every day | ORAL | 5 refills | Status: DC

## 2016-07-01 MED ORDER — LAMOTRIGINE 100 MG PO TABS: 100.0000 mg | ORAL_TABLET | Freq: Two times a day (BID) | ORAL | 5 refills | Status: DC

## 2016-07-01 NOTE — Progress Notes (Signed)
Follow-up patient who has had worsening symptoms of depression and anxiety. He comes in this time saying that he is feeling completely back to normal. He went back on his normal medicine Lamictal and Zyprexa and tried taking the lithium as I had correctly instructed him however even without that he says he is now feeling better. He thinks that perhaps the entire episode was just a matter of coming off the cigarettes.  Neatly dressed and groomed. Good eye contact. Normal psychomotor activity. Speech normal rate tone and volume. Affect euthymic. No suicidal or homicidal ideation. No sign of psychosis.  Continue current medicine including the lithium which she is taking sort of as a when necessary medicine. We will follow-up in another 3 months.

## 2016-07-12 ENCOUNTER — Telehealth: Payer: Self-pay | Admitting: Psychiatry

## 2016-07-12 ENCOUNTER — Other Ambulatory Visit: Payer: Self-pay | Admitting: Psychiatry

## 2016-07-12 ENCOUNTER — Ambulatory Visit: Payer: Medicare Other | Admitting: Psychiatry

## 2016-07-12 MED ORDER — OLANZAPINE 15 MG PO TABS: 30.0000 mg | ORAL_TABLET | Freq: Every day | ORAL | 5 refills | Status: DC

## 2016-07-12 MED ORDER — LAMOTRIGINE 100 MG PO TABS: 100.0000 mg | ORAL_TABLET | Freq: Two times a day (BID) | ORAL | 5 refills | Status: DC

## 2016-07-12 NOTE — Telephone Encounter (Signed)
I reordered these

## 2016-09-28 ENCOUNTER — Ambulatory Visit (INDEPENDENT_AMBULATORY_CARE_PROVIDER_SITE_OTHER): Payer: Medicare Other | Admitting: Psychiatry

## 2016-09-28 ENCOUNTER — Encounter: Payer: Self-pay | Admitting: Psychiatry

## 2016-09-28 VITALS — BP 132/81 | HR 73 | Temp 98.4°F | Wt 225.2 lb

## 2016-09-28 DIAGNOSIS — F333 Major depressive disorder, recurrent, severe with psychotic symptoms: Secondary | ICD-10-CM

## 2016-09-28 DIAGNOSIS — F17213 Nicotine dependence, cigarettes, with withdrawal: Secondary | ICD-10-CM | POA: Diagnosis not present

## 2016-09-28 MED ORDER — LAMOTRIGINE 100 MG PO TABS: 100.0000 mg | ORAL_TABLET | Freq: Two times a day (BID) | ORAL | 5 refills | Status: DC

## 2016-09-28 MED ORDER — OLANZAPINE 15 MG PO TABS: 30.0000 mg | ORAL_TABLET | Freq: Every day | ORAL | 5 refills | Status: DC

## 2016-09-28 NOTE — Progress Notes (Signed)
Follow-up 46 year old man with recurrent depression sometimes with psychotic features. Since his bout earlier this year with substance use he says he has continued to stay off of drugs although he is still smoking cigarettes. Mood is now stable. Not having any signs of serious depression. Sleeping well. No psychosis. Tolerating medicine without difficulty. We talked a bit about his smoking. He has not had good success with medications for smoking but is planning to continue trying to make some effort to quit.  Neatly dressed and groomed. Good eye contact and normal psychomotor activity. Normal speech. No sign of psychotic thinking or suicidal ideation.  Renew medicine just the Zyprexa and Lamictal. Follow-up in 6 months.

## 2016-10-31 DIAGNOSIS — Z23 Encounter for immunization: Secondary | ICD-10-CM | POA: Diagnosis not present

## 2016-12-16 ENCOUNTER — Encounter: Payer: Self-pay | Admitting: Psychiatry

## 2016-12-16 ENCOUNTER — Other Ambulatory Visit: Payer: Self-pay

## 2016-12-16 ENCOUNTER — Ambulatory Visit (INDEPENDENT_AMBULATORY_CARE_PROVIDER_SITE_OTHER): Payer: Medicare Other | Admitting: Psychiatry

## 2016-12-16 VITALS — BP 159/90 | HR 85 | Temp 97.8°F | Wt 230.0 lb

## 2016-12-16 DIAGNOSIS — F333 Major depressive disorder, recurrent, severe with psychotic symptoms: Secondary | ICD-10-CM | POA: Diagnosis not present

## 2016-12-16 DIAGNOSIS — F17213 Nicotine dependence, cigarettes, with withdrawal: Secondary | ICD-10-CM

## 2016-12-16 MED ORDER — OLANZAPINE 15 MG PO TABS: 30.0000 mg | ORAL_TABLET | Freq: Every day | ORAL | 1 refills | Status: DC

## 2016-12-16 MED ORDER — LAMOTRIGINE 100 MG PO TABS: 100.0000 mg | ORAL_TABLET | Freq: Two times a day (BID) | ORAL | 1 refills | Status: DC

## 2016-12-16 NOTE — Progress Notes (Signed)
Patient came in today specifically to talk about his tobacco dependence problem.  He says his mood has been fine.  No depression no mania no psychotic symptoms.  He has become desperate however to quit smoking.  Neatly dressed and groomed.  Eye contact good.  Psychomotor activity normal.  Thoughts lucid.  No evidence of dangerousness.  Good insight and judgment normal cognition.  Apparently having not been able to tolerate Chantix or bupropion, he has located a program in [place] which supposedly has an inpatient component and will help him to stop smoking.  He says they have told him they will be giving him a medicine that is an MAO inhibitor and wanted clearance from his psychiatrist.  I told him that I could not vouch in anyway for the medicine he was discussing as to its effectiveness for smoking but that there was nothing in his current medicine regimen that would contraindicate a monoamine oxidase inhibitor.  I agreed to send an email to this effect to the address he gave me.  Renewed his prescription for Lamictal and Zyprexa.  Follow-up 6 months.

## 2017-03-15 ENCOUNTER — Ambulatory Visit (INDEPENDENT_AMBULATORY_CARE_PROVIDER_SITE_OTHER): Payer: Medicare Other | Admitting: Psychiatry

## 2017-03-15 ENCOUNTER — Encounter: Payer: Self-pay | Admitting: Psychiatry

## 2017-03-15 ENCOUNTER — Other Ambulatory Visit: Payer: Self-pay

## 2017-03-15 VITALS — BP 151/89 | HR 85 | Temp 97.2°F | Wt 233.8 lb

## 2017-03-15 DIAGNOSIS — F17213 Nicotine dependence, cigarettes, with withdrawal: Secondary | ICD-10-CM | POA: Diagnosis not present

## 2017-03-15 DIAGNOSIS — F333 Major depressive disorder, recurrent, severe with psychotic symptoms: Secondary | ICD-10-CM

## 2017-03-15 NOTE — Progress Notes (Signed)
Follow-up for patient has a history of anxiety and psychotic depression.  He comes back today for a follow-up 3 months early with his anxiety about his cigarette smoking still being his major issue.  He is not complaining of depression or psychotic symptoms but he is very upset about how much difficulty he is having quitting smoking.  Apparently he was not able to get into this program he was talking about last time.  I looked it up on the Internet with him and it looks very peculiar.  In fact it is not a program aimed at cigarette smoking but a "church" that uses some kind of Quentin drug for spiritual reasons.  I advised him that I doubted that this would be helpful to him but he seems convinced.  In the meantime I told him that given his failure of Wellbutrin and Chantix there really was not another medication that was likely to help him stop smoking since he rejects using nicotine replacement.  Patient is neatly dressed and groomed.  Brings his father with him today.  No sign of dangerousness.  Not appearing to be grossly psychotic or bizarre.  Anxious however.  Not suicidal.  I ended up suggesting that he simply leave his Zyprexa and lamotrigine alone which she is very agreeable to doing and keep working on gradually tapering down his cigarette smoking and tried to convince him that the problem was not nearly as acute as he thinks it is.  I will see him back in 3 months.

## 2017-03-22 ENCOUNTER — Ambulatory Visit: Payer: Medicare Other | Admitting: Psychiatry

## 2017-04-14 ENCOUNTER — Other Ambulatory Visit: Payer: Self-pay | Admitting: Psychiatry

## 2017-04-14 MED ORDER — OLANZAPINE 15 MG PO TABS: 45.0000 mg | ORAL_TABLET | Freq: Every day | ORAL | 1 refills | Status: DC

## 2017-04-14 NOTE — Progress Notes (Signed)
The patient's father dropped by the office today to tell me about her recent deterioration.  Apparently for several days now Sorin has been keeping himself in his bedroom 24 hours a day and talking about how radiation is getting to him.  He will not even go out in any light because of this delusion about radiation.  Father does not think that he is dangerous to himself there is no sign that he is doing anything to hurt himself.  Mother is pretty sure he is not using drugs and that he is still compliant with medicine.  Apparently Adair is unwilling to talk on the telephone as well.  I advised the father that the best thing would be if Garion could come into the hospital so I can see him face-to-face but the patient does not seem to meet commitment criteria and is reported to be unwilling to come in.  Under the circumstances my first recommendation would be to increase the olanzapine to 45 mg at night.  I am aware this is a high dose but he already tolerates the 30 without any trouble and 45 is not an outrageous dose.    Patient called in or electronically placed to the [facility].  Reviewed with father the signs of any dangerousness and encouraged him to try to get Akari to come in and see me and that if things get dangerous to get him into the emergency room.

## 2017-05-16 ENCOUNTER — Telehealth: Payer: Self-pay

## 2017-05-16 NOTE — Telephone Encounter (Signed)
pt called states he only has 6 days left for his medication. pt was advised that dr. Weber Cooks was sent a message. and that I also did a prior auth to see if insurance would approve and it is pending

## 2017-05-16 NOTE — Telephone Encounter (Signed)
received fax that patient insurance will only pay for olanzapine up to 2 pills.  please make adjustment on rx and send new rxs.

## 2017-05-16 NOTE — Telephone Encounter (Signed)
prior Josem Kaufmann is pending

## 2017-05-17 ENCOUNTER — Other Ambulatory Visit: Payer: Self-pay | Admitting: Psychiatry

## 2017-05-17 MED ORDER — OLANZAPINE 15 MG PO TABS: 45.0000 mg | ORAL_TABLET | Freq: Every day | ORAL | 1 refills | Status: DC

## 2017-05-17 NOTE — Telephone Encounter (Signed)
received fax that prior auth was denied. the max dosage for this medication is 2 tablets per day.  the medication will have to be adjusted to reflect no more than 2 tablets per day.

## 2017-05-18 ENCOUNTER — Other Ambulatory Visit: Payer: Self-pay | Admitting: Psychiatry

## 2017-05-18 MED ORDER — OLANZAPINE 20 MG PO TABS: 40.0000 mg | ORAL_TABLET | Freq: Every day | ORAL | 3 refills | Status: DC

## 2017-05-18 NOTE — Telephone Encounter (Signed)
pt called check on his medication.

## 2017-05-18 NOTE — Telephone Encounter (Signed)
In order to get around the prior authorization I suggest we change the dose to 40 mg and that I put in a prescription for the 20 mg strength 2 of them at night.  He agrees to give this a try and the prescription is done.

## 2017-05-19 NOTE — Telephone Encounter (Signed)
Astonishing.  First they say that they will only pay for 2 tablets a day then they turn around and say they will only pay for 1 tablet a day.  I have no idea what I am supposed to do about this.  I really hope the patient does not get sick and become psychotic.

## 2017-05-19 NOTE — Telephone Encounter (Signed)
received fax that insurance will only pay for 1 tablet a day not 2 tablet of the olanzapine 20mg 

## 2017-06-21 ENCOUNTER — Ambulatory Visit: Payer: Medicare Other | Admitting: Psychiatry

## 2017-06-23 ENCOUNTER — Telehealth: Payer: Self-pay

## 2017-06-23 ENCOUNTER — Other Ambulatory Visit: Payer: Self-pay | Admitting: Psychiatry

## 2017-06-23 MED ORDER — LAMOTRIGINE 100 MG PO TABS: 100.0000 mg | ORAL_TABLET | Freq: Two times a day (BID) | ORAL | 1 refills | Status: DC

## 2017-06-23 NOTE — Telephone Encounter (Signed)
lamoTRIgine (LAMICTAL) 100 MG tablet  Medication  Date: 12/16/2016 Department: [facility] Ordering/Authorizing: Clapacs, Madie Reno, MD  Order Providers   Prescribing Provider Encounter Provider  Clapacs, Madie Reno, MD Clapacs, Madie Reno, MD  Outpatient Medication Detail    Disp Refills Start End   lamoTRIgine (LAMICTAL) 100 MG tablet 180 tablet 1 12/16/2016    Sig - Route: Take 1 tablet (100 mg total) by mouth 2 (two) times daily. - Oral   Sent to pharmacy as: lamoTRIgine (LAMICTAL) 100 MG tablet   E-Prescribing Status: Receipt confirmed by pharmacy (12/16/2016 6:22 PM EST)    Pt called requested a refill on his lamictal  Pt last seen march

## 2017-06-23 NOTE — Telephone Encounter (Signed)
Done

## 2017-09-14 ENCOUNTER — Other Ambulatory Visit: Payer: Self-pay | Admitting: Psychiatry

## 2017-09-15 ENCOUNTER — Encounter: Payer: Self-pay | Admitting: Psychiatry

## 2017-09-15 ENCOUNTER — Ambulatory Visit (INDEPENDENT_AMBULATORY_CARE_PROVIDER_SITE_OTHER): Payer: Medicare Other | Admitting: Psychiatry

## 2017-09-15 DIAGNOSIS — F333 Major depressive disorder, recurrent, severe with psychotic symptoms: Secondary | ICD-10-CM | POA: Diagnosis not present

## 2017-09-15 DIAGNOSIS — F17213 Nicotine dependence, cigarettes, with withdrawal: Secondary | ICD-10-CM

## 2017-09-15 DIAGNOSIS — F5101 Primary insomnia: Secondary | ICD-10-CM | POA: Diagnosis not present

## 2017-09-15 MED ORDER — LAMOTRIGINE 100 MG PO TABS: 100.0000 mg | ORAL_TABLET | Freq: Two times a day (BID) | ORAL | 1 refills | Status: DC

## 2017-09-15 MED ORDER — OLANZAPINE 20 MG PO TABS: 40.0000 mg | ORAL_TABLET | Freq: Every day | ORAL | 5 refills | Status: DC

## 2017-09-15 NOTE — Progress Notes (Signed)
Follow-up patient with recurrent psychotic depression.  Patient is reporting that he is feeling much better than on our last visit.  Not nearly as anxious or depressed.  Sleeping okay.  He does have some orthopedic problems which are keeping him from exercising which she says gets him down a little bit but not to the point of having any suicidal thoughts.  He is still smoking heavily but is less distressed by it.  Has not gone back to any substance abuse.  Neatly dressed and groomed.  Good eye contact normal psychomotor activity.  Speech normal rate tone and volume.  Thoughts lucid no evidence loosening of association or dangerousness.  Supportive counseling and therapy and review of treatment plans.  He just wants to renew his Zyprexa and the lamotrigine which is fine.  Review of medication follow-up 6 months.

## 2017-11-02 DIAGNOSIS — M25561 Pain in right knee: Secondary | ICD-10-CM | POA: Diagnosis not present

## 2017-11-03 ENCOUNTER — Other Ambulatory Visit: Payer: Self-pay | Admitting: Physician Assistant

## 2017-11-03 DIAGNOSIS — M25561 Pain in right knee: Secondary | ICD-10-CM

## 2017-11-09 ENCOUNTER — Ambulatory Visit: Payer: Self-pay

## 2017-11-30 DIAGNOSIS — Z23 Encounter for immunization: Secondary | ICD-10-CM | POA: Diagnosis not present

## 2018-02-09 ENCOUNTER — Other Ambulatory Visit: Payer: Self-pay

## 2018-02-09 ENCOUNTER — Ambulatory Visit (INDEPENDENT_AMBULATORY_CARE_PROVIDER_SITE_OTHER): Payer: Medicare Other | Admitting: Psychiatry

## 2018-02-09 ENCOUNTER — Encounter: Payer: Self-pay | Admitting: Psychiatry

## 2018-02-09 VITALS — BP 123/70 | HR 85 | Temp 97.9°F | Wt 213.4 lb

## 2018-02-09 DIAGNOSIS — F333 Major depressive disorder, recurrent, severe with psychotic symptoms: Secondary | ICD-10-CM

## 2018-02-09 DIAGNOSIS — F5101 Primary insomnia: Secondary | ICD-10-CM | POA: Diagnosis not present

## 2018-02-13 ENCOUNTER — Encounter: Payer: Self-pay | Admitting: Psychiatry

## 2018-02-13 MED ORDER — LAMOTRIGINE 100 MG PO TABS: 100.0000 mg | ORAL_TABLET | Freq: Two times a day (BID) | ORAL | 1 refills | Status: DC

## 2018-02-13 MED ORDER — OLANZAPINE 20 MG PO TABS: 40.0000 mg | ORAL_TABLET | Freq: Every day | ORAL | 5 refills | Status: DC

## 2018-02-13 NOTE — Progress Notes (Signed)
Follow-up patient with schizoaffective disorder.  No new complaints.  Mood stable.  No suicidal or homicidal ideation.  No active psychotic symptoms.  Concerned because he got a jury summons.  Neatly dressed and groomed good eye contact normal psychomotor activity.  Speech normal rate tone and volume.  Affect euthymic no evidence of suicidality or acute psychosis.  I wrote him a letter vouching that he would not be capable of providing jury service which I think is absolutely correct.  Refill medicines follow-up 6 months.

## 2018-03-16 ENCOUNTER — Ambulatory Visit: Payer: Medicare Other | Admitting: Psychiatry

## 2018-07-27 ENCOUNTER — Encounter: Payer: Self-pay | Admitting: Psychiatry

## 2018-07-27 ENCOUNTER — Ambulatory Visit (INDEPENDENT_AMBULATORY_CARE_PROVIDER_SITE_OTHER): Payer: Medicare Other | Admitting: Psychiatry

## 2018-07-27 ENCOUNTER — Other Ambulatory Visit: Payer: Self-pay

## 2018-07-27 DIAGNOSIS — F333 Major depressive disorder, recurrent, severe with psychotic symptoms: Secondary | ICD-10-CM | POA: Diagnosis not present

## 2018-07-27 MED ORDER — LAMOTRIGINE 100 MG PO TABS: 100.0000 mg | ORAL_TABLET | Freq: Two times a day (BID) | ORAL | 1 refills | Status: DC

## 2018-07-27 MED ORDER — OLANZAPINE 20 MG PO TABS: 40.0000 mg | ORAL_TABLET | Freq: Every day | ORAL | 5 refills | Status: DC

## 2018-07-27 NOTE — Progress Notes (Signed)
Follow-up for this patient with bipolar disorder or recurrent psychotic depression.  Patient says that he is "chemically fine".  Spoke to him by telephone.  Good conversation.  He was lucid and appropriate.  Did not have any new complaints.  Denies mood swings denies depression.  Denies any hallucinations recently.  Tolerating medicine without any awareness of any side effects.  Alert and oriented.  Appropriate full range affect.  No sign of disorganized thinking.  No suicidal thought no sign of dangerousness.  Patient continues to take lamotrigine 100 mg twice a day as well as Zyprexa 20 mg twice a day which has long been well-tolerated and helps in preventing him from relapsing into hallucinations and mood swings.  Refilled all medications.  Supportive counseling and therapy.  Follow-up 6 months or as needed.

## 2018-09-11 DIAGNOSIS — F3341 Major depressive disorder, recurrent, in partial remission: Secondary | ICD-10-CM | POA: Diagnosis not present

## 2018-09-11 DIAGNOSIS — R011 Cardiac murmur, unspecified: Secondary | ICD-10-CM | POA: Diagnosis not present

## 2018-09-11 DIAGNOSIS — Z720 Tobacco use: Secondary | ICD-10-CM | POA: Diagnosis not present

## 2018-09-11 DIAGNOSIS — I10 Essential (primary) hypertension: Secondary | ICD-10-CM | POA: Diagnosis not present

## 2018-09-11 DIAGNOSIS — Z125 Encounter for screening for malignant neoplasm of prostate: Secondary | ICD-10-CM | POA: Diagnosis not present

## 2018-09-11 DIAGNOSIS — E7800 Pure hypercholesterolemia, unspecified: Secondary | ICD-10-CM | POA: Diagnosis not present

## 2018-09-11 DIAGNOSIS — Z79899 Other long term (current) drug therapy: Secondary | ICD-10-CM | POA: Diagnosis not present

## 2018-12-19 ENCOUNTER — Other Ambulatory Visit: Payer: Self-pay | Admitting: Orthopedic Surgery

## 2018-12-19 DIAGNOSIS — M2391 Unspecified internal derangement of right knee: Secondary | ICD-10-CM

## 2018-12-21 ENCOUNTER — Telehealth: Payer: Self-pay | Admitting: *Deleted

## 2019-01-02 ENCOUNTER — Other Ambulatory Visit: Payer: Self-pay

## 2019-01-02 ENCOUNTER — Ambulatory Visit (INDEPENDENT_AMBULATORY_CARE_PROVIDER_SITE_OTHER): Payer: Medicare Other | Admitting: Psychiatry

## 2019-01-02 DIAGNOSIS — F333 Major depressive disorder, recurrent, severe with psychotic symptoms: Secondary | ICD-10-CM

## 2019-01-02 MED ORDER — LAMOTRIGINE 100 MG PO TABS: 100.0000 mg | ORAL_TABLET | Freq: Two times a day (BID) | ORAL | 1 refills | Status: DC

## 2019-01-02 MED ORDER — OLANZAPINE 20 MG PO TABS: 40.0000 mg | ORAL_TABLET | Freq: Every day | ORAL | 5 refills | Status: DC

## 2019-01-02 NOTE — Progress Notes (Signed)
Attempted to reach patient by telephone at his listed number several times.  Each time recording said that my call could not be completed as dialed at this time.  Not certain what is going on but I will try and contact him again later.  Patient called back and it turned out we had the wrong area code.  Reached him on the phone.  Patient has no new complaints.  Continues to work on trying to quit smoking.  Mood has been stable.  No hallucinations no psychotic symptoms.  No other new health problems.  Patient was alert and oriented and appropriate.  No bizarre or disorganized or paranoid thinking.  Denied suicidal or homicidal ideation.  Denied current use of any other drugs besides the nicotine.  Reviewed medication.  Refilled all medicines as currently ordered.  We can check up again in 6 months.

## 2019-01-31 ENCOUNTER — Other Ambulatory Visit: Payer: Self-pay | Admitting: Orthopedic Surgery

## 2019-01-31 DIAGNOSIS — G8929 Other chronic pain: Secondary | ICD-10-CM

## 2019-02-12 ENCOUNTER — Ambulatory Visit
Admission: RE | Admit: 2019-02-12 | Discharge: 2019-02-12 | Disposition: A | Discharge status: Home / Self Care | Payer: Medicare Other | Source: Ambulatory Visit | Attending: Orthopedic Surgery | Admitting: Orthopedic Surgery

## 2019-02-12 ENCOUNTER — Other Ambulatory Visit: Payer: Self-pay

## 2019-02-12 DIAGNOSIS — M545 Low back pain, unspecified: Secondary | ICD-10-CM

## 2019-02-12 DIAGNOSIS — G8929 Other chronic pain: Secondary | ICD-10-CM | POA: Diagnosis present

## 2019-05-01 ENCOUNTER — Other Ambulatory Visit: Payer: Self-pay | Admitting: Orthopedic Surgery

## 2019-05-01 ENCOUNTER — Other Ambulatory Visit (HOSPITAL_COMMUNITY): Payer: Self-pay | Admitting: Orthopedic Surgery

## 2019-05-01 DIAGNOSIS — M2391 Unspecified internal derangement of right knee: Secondary | ICD-10-CM

## 2019-05-08 ENCOUNTER — Ambulatory Visit
Admission: RE | Admit: 2019-05-08 | Discharge: 2019-05-08 | Disposition: A | Discharge status: Home / Self Care | Payer: Medicare Other | Source: Ambulatory Visit | Attending: Orthopedic Surgery | Admitting: Orthopedic Surgery

## 2019-05-08 ENCOUNTER — Other Ambulatory Visit: Payer: Self-pay

## 2019-05-08 DIAGNOSIS — M2391 Unspecified internal derangement of right knee: Secondary | ICD-10-CM | POA: Insufficient documentation

## 2019-06-19 ENCOUNTER — Encounter: Payer: Self-pay | Admitting: Psychiatry

## 2019-06-19 ENCOUNTER — Other Ambulatory Visit: Payer: Self-pay

## 2019-06-19 ENCOUNTER — Telehealth (INDEPENDENT_AMBULATORY_CARE_PROVIDER_SITE_OTHER): Payer: Medicare Other | Admitting: Psychiatry

## 2019-06-19 DIAGNOSIS — F333 Major depressive disorder, recurrent, severe with psychotic symptoms: Secondary | ICD-10-CM

## 2019-06-19 MED ORDER — LAMOTRIGINE 100 MG PO TABS: 100.0000 mg | ORAL_TABLET | Freq: Two times a day (BID) | ORAL | 1 refills | Status: DC

## 2019-06-19 MED ORDER — OLANZAPINE 20 MG PO TABS: 40.0000 mg | ORAL_TABLET | Freq: Every day | ORAL | 5 refills | Status: DC

## 2019-06-19 NOTE — Progress Notes (Signed)
Follow-up note for this 49 year old man with chronic psychotic symptoms depression paranoia.  Reached him on the telephone.  Identified myself and he identified himself.  Patient himself has no specific complaints.  He talks about how the last couple years have been hard for him because of his medical problems but that he is thinking about going back to the gym and getting back in shape.  Denies any suicidal or homicidal thought.  Denies any hallucinations.  Still taking his Lamictal and Zyprexa regularly.  Seems a little loose in his thinking but nothing frankly bizarre or obviously delusional and no report of any hallucinations.  Normal tone of voice normal affect appropriate interaction alert and oriented x4.  No current suicidal thinking.  No obvious psychosis.  Supportive therapy.  Review of medication.  Refilled everything follow-up 6 months.

## 2019-12-11 ENCOUNTER — Other Ambulatory Visit: Payer: Self-pay

## 2019-12-11 ENCOUNTER — Encounter: Payer: Self-pay | Admitting: Psychiatry

## 2019-12-11 ENCOUNTER — Telehealth (INDEPENDENT_AMBULATORY_CARE_PROVIDER_SITE_OTHER): Payer: Medicare Other | Admitting: Psychiatry

## 2019-12-11 DIAGNOSIS — F333 Major depressive disorder, recurrent, severe with psychotic symptoms: Secondary | ICD-10-CM | POA: Diagnosis not present

## 2019-12-11 DIAGNOSIS — F5101 Primary insomnia: Secondary | ICD-10-CM | POA: Diagnosis not present

## 2019-12-11 MED ORDER — OLANZAPINE 20 MG PO TABS: 40.0000 mg | ORAL_TABLET | Freq: Every day | ORAL | 5 refills | Status: DC

## 2019-12-11 MED ORDER — LAMOTRIGINE 100 MG PO TABS: 100.0000 mg | ORAL_TABLET | Freq: Two times a day (BID) | ORAL | 1 refills | Status: DC

## 2019-12-11 NOTE — Progress Notes (Signed)
Virtual Visit via Telephone Note  I connected with Rodney Morrison on 12/11/19 at  1:40 PM EST by telephone and verified that I am speaking with the correct person using two identifiers.  Location: Patient: Home Provider: Hospital   I discussed the limitations, risks, security and privacy concerns of performing an evaluation and management service by telephone and the availability of in person appointments. I also discussed with the patient that there may be a patient responsible charge related to this service. The patient expressed understanding and agreed to proceed.   History of Present Illness: Patient reached by telephone.  Identified self and patient.  He has no specific new complaints.  Mood remains generally stable.  He does feel like he has memory problems but when he describes them they are not that severe.  Things like needing to write down a list if he goes to the store.  Not debilitating.  Not having any hallucinations paranoia or psychotic symptoms.  Tolerating medicine well.  Not oversedated.    Observations/Objective: Slightly blunted affect.  Lucid and interactive appropriately no evidence of delusions or psychosis.  No suicidal or homicidal ideation.  Alert and oriented.   Assessment and Plan: Stable chronic mood and psychotic symptoms under good control with current medicine.  Review plans for continued Lamictal and olanzapine at current doses.  Follow-up 6 months.  Prescriptions refilled to his pharmacy   Follow Up Instructions: Follow-up 6 months    I discussed the assessment and treatment plan with the patient. The patient was provided an opportunity to ask questions and all were answered. The patient agreed with the plan and demonstrated an understanding of the instructions.   The patient was advised to call back or seek an in-person evaluation if the symptoms worsen or if the condition fails to improve as anticipated.  I provided 20 minutes of non-face-to-face time  during this encounter.   Alethia Berthold, MD

## 2020-05-20 ENCOUNTER — Other Ambulatory Visit: Payer: Self-pay

## 2020-05-20 ENCOUNTER — Telehealth (HOSPITAL_BASED_OUTPATIENT_CLINIC_OR_DEPARTMENT_OTHER): Payer: Medicare Other | Admitting: Psychiatry

## 2020-05-20 DIAGNOSIS — F333 Major depressive disorder, recurrent, severe with psychotic symptoms: Secondary | ICD-10-CM | POA: Diagnosis not present

## 2020-05-20 MED ORDER — OLANZAPINE 20 MG PO TABS: 40.0000 mg | ORAL_TABLET | Freq: Every day | ORAL | 5 refills | Status: DC

## 2020-05-20 MED ORDER — LAMOTRIGINE 100 MG PO TABS: 100.0000 mg | ORAL_TABLET | Freq: Two times a day (BID) | ORAL | 1 refills | Status: DC

## 2020-05-20 NOTE — Progress Notes (Signed)
Virtual Visit via Telephone Note  I connected with Rodney Morrison on 05/20/20 at  1:40 PM EDT by telephone and verified that I am speaking with the correct person using two identifiers.  Location: Patient: Home Provider: Hospital   I discussed the limitations, risks, security and privacy concerns of performing an evaluation and management service by telephone and the availability of in person appointments. I also discussed with the patient that there may be a patient responsible charge related to this service. The patient expressed understanding and agreed to proceed.   History of Present Illness: Patient reached by telephone.  No new complaints.  Has some chronic medical issues but has been trying to get out and be more active.  No return of depression and no hallucinations.    Observations/Objective: Alert and oriented.  Appropriately interactive.  No loosening of association.  Affect is appropriate.  Denies suicidal or homicidal thought.   Assessment and Plan: Continue plan with current medication.  Refill Zyprexa and lamotrigine   Follow Up Instructions: Follow-up 6 months    I discussed the assessment and treatment plan with the patient. The patient was provided an opportunity to ask questions and all were answered. The patient agreed with the plan and demonstrated an understanding of the instructions.   The patient was advised to call back or seek an in-person evaluation if the symptoms worsen or if the condition fails to improve as anticipated.  I provided 20 minutes of non-face-to-face time during this encounter.   Alethia Berthold, MD

## 2020-10-28 ENCOUNTER — Telehealth (INDEPENDENT_AMBULATORY_CARE_PROVIDER_SITE_OTHER): Payer: Medicare Other | Admitting: Psychiatry

## 2020-10-28 ENCOUNTER — Other Ambulatory Visit: Payer: Self-pay

## 2020-10-28 DIAGNOSIS — F333 Major depressive disorder, recurrent, severe with psychotic symptoms: Secondary | ICD-10-CM

## 2020-10-28 DIAGNOSIS — F5101 Primary insomnia: Secondary | ICD-10-CM | POA: Diagnosis not present

## 2020-10-28 MED ORDER — LAMOTRIGINE 100 MG PO TABS: 100.0000 mg | ORAL_TABLET | Freq: Two times a day (BID) | ORAL | 1 refills | Status: DC

## 2020-10-28 MED ORDER — OLANZAPINE 20 MG PO TABS: 40.0000 mg | ORAL_TABLET | Freq: Every day | ORAL | 5 refills | Status: DC

## 2020-10-28 NOTE — Progress Notes (Signed)
Virtual Visit via Telephone Note  I connected with Glean Salen on 10/28/20 at  1:40 PM EDT by telephone and verified that I am speaking with the correct person using two identifiers.  Location: Patient: Home Provider: Hospital   I discussed the limitations, risks, security and privacy concerns of performing an evaluation and management service by telephone and the availability of in person appointments. I also discussed with the patient that there may be a patient responsible charge related to this service. The patient expressed understanding and agreed to proceed.   History of Present Illness: Patient seen and chart reviewed.  Patient was contacted by phone.  He was on time and appropriate.  Identities established.  Patient has no new complaints.  Reports that mood has been stable.  No episodes of mania or depression.  Currently feeling stable.  Denies suicidal or homicidal thoughts.  Denies any psychotic symptoms.    Observations/Objective: Alert and oriented appropriate affect.  Thoughts lucid.  No evidence of loosening of associations or delusions.   Assessment and Plan: Reviewed medications.  Review uses and doses.  Refill medications follow-up in 6 months   Follow Up Instructions:    I discussed the assessment and treatment plan with the patient. The patient was provided an opportunity to ask questions and all were answered. The patient agreed with the plan and demonstrated an understanding of the instructions.   The patient was advised to call back or seek an in-person evaluation if the symptoms worsen or if the condition fails to improve as anticipated.  I provided 20 minutes of non-face-to-face time during this encounter.   Alethia Berthold, MD

## 2021-04-07 ENCOUNTER — Telehealth (INDEPENDENT_AMBULATORY_CARE_PROVIDER_SITE_OTHER): Payer: Medicare Other | Admitting: Psychiatry

## 2021-04-07 ENCOUNTER — Other Ambulatory Visit: Payer: Self-pay

## 2021-04-07 DIAGNOSIS — F333 Major depressive disorder, recurrent, severe with psychotic symptoms: Secondary | ICD-10-CM | POA: Diagnosis not present

## 2021-04-07 DIAGNOSIS — F5101 Primary insomnia: Secondary | ICD-10-CM

## 2021-04-07 MED ORDER — LAMOTRIGINE 100 MG PO TABS: 100.0000 mg | ORAL_TABLET | Freq: Two times a day (BID) | ORAL | 1 refills | Status: DC

## 2021-04-07 MED ORDER — OLANZAPINE 20 MG PO TABS: 40.0000 mg | ORAL_TABLET | Freq: Every day | ORAL | 5 refills | Status: DC

## 2021-04-07 NOTE — Progress Notes (Signed)
Virtual Visit via Telephone Note ? ?I connected with Rodney Morrison on 04/07/21 at  1:20 PM EDT by telephone and verified that I am speaking with the correct person using two identifiers. ? ?Location: ?Patient: Home ?Provider: Hospital ?  ?I discussed the limitations, risks, security and privacy concerns of performing an evaluation and management service by telephone and the availability of in person appointments. I also discussed with the patient that there may be a patient responsible charge related to this service. The patient expressed understanding and agreed to proceed. ? ? ?History of Present Illness: Patient reached by telephone.  Identities established.  Patient has no new complaint.  Mood has been stable.  No return of any psychotic symptoms.  No hallucinations no delusions.  Denies suicidal or homicidal ideation.  Continues to feel stable on medication.  Still struggling with multiple medical issues mostly pain ? ?  ?Observations/Objective: ?Alert and oriented lucid and clear in thinking.  No agitation and no sign of psychosis ? ?Assessment and Plan: Stable.  Refilled lamotrigine and olanzapine follow-up 6 months ? ? ?Follow Up Instructions: ? ?  ?I discussed the assessment and treatment plan with the patient. The patient was provided an opportunity to ask questions and all were answered. The patient agreed with the plan and demonstrated an understanding of the instructions. ?  ?The patient was advised to call back or seek an in-person evaluation if the symptoms worsen or if the condition fails to improve as anticipated. ? ?I provided 20 minutes of non-face-to-face time during this encounter. ? ? ?Alethia Berthold, MD ? ?

## 2021-09-15 ENCOUNTER — Telehealth (INDEPENDENT_AMBULATORY_CARE_PROVIDER_SITE_OTHER): Payer: Medicare Other | Admitting: Psychiatry

## 2021-09-15 DIAGNOSIS — F333 Major depressive disorder, recurrent, severe with psychotic symptoms: Secondary | ICD-10-CM

## 2021-09-15 MED ORDER — OLANZAPINE 20 MG PO TABS: 40.0000 mg | ORAL_TABLET | Freq: Every day | ORAL | 5 refills | Status: DC

## 2021-09-15 MED ORDER — LAMOTRIGINE 100 MG PO TABS: 100.0000 mg | ORAL_TABLET | Freq: Two times a day (BID) | ORAL | 1 refills | Status: DC

## 2021-09-15 NOTE — Progress Notes (Signed)
Virtual Visit via Telephone Note  I connected with Rodney Morrison on 09/15/21 at  1:00 PM EDT by telephone and verified that I am speaking with the correct person using two identifiers.  Location: Patient: Home Provider: Hospital   I discussed the limitations, risks, security and privacy concerns of performing an evaluation and management service by telephone and the availability of in person appointments. I also discussed with the patient that there may be a patient responsible charge related to this service. The patient expressed understanding and agreed to proceed.   History of Present Illness: Reach patient by telephone.  Identities established.  Patient has no new complaints.  Mood has been stable.  No mania or depression or psychotic symptoms.  He questions why his olanzapine has been costing several dollars more and says his insurance is not covering it.  He says that they supposedly sent information to our office.  I will have to look into it.  Alert and oriented.  Lucid.  Calm sounding.  No complaints.  Orthopedic problems improving    Observations/Objective: Alert and oriented.  Affect euthymic and appropriate.  Thoughts lucid no evidence of dangerousness   Assessment and Plan: Refilled medication and I will see if I can figure out anything about why his insurance is not covering it.  Follow-up 6 months   Follow Up Instructions:    I discussed the assessment and treatment plan with the patient. The patient was provided an opportunity to ask questions and all were answered. The patient agreed with the plan and demonstrated an understanding of the instructions.   The patient was advised to call back or seek an in-person evaluation if the symptoms worsen or if the condition fails to improve as anticipated.  I provided 20 minutes of non-face-to-face time during this encounter.   Alethia Berthold, MD

## 2021-09-22 ENCOUNTER — Telehealth (INDEPENDENT_AMBULATORY_CARE_PROVIDER_SITE_OTHER): Payer: Medicare Other | Admitting: Psychiatry

## 2021-09-22 DIAGNOSIS — F333 Major depressive disorder, recurrent, severe with psychotic symptoms: Secondary | ICD-10-CM

## 2021-09-22 DIAGNOSIS — F3130 Bipolar disorder, current episode depressed, mild or moderate severity, unspecified: Secondary | ICD-10-CM | POA: Diagnosis not present

## 2021-09-22 MED ORDER — OLANZAPINE 20 MG PO TABS: 40.0000 mg | ORAL_TABLET | Freq: Every day | ORAL | 5 refills | Status: DC

## 2021-09-22 MED ORDER — OLANZAPINE 20 MG PO TABS: 20.0000 mg | ORAL_TABLET | Freq: Every evening | ORAL | 5 refills | Status: DC | PRN

## 2021-09-22 MED ORDER — OLANZAPINE 20 MG PO TABS: 20.0000 mg | ORAL_TABLET | Freq: Every day | ORAL | 5 refills | Status: DC

## 2021-09-22 NOTE — Progress Notes (Signed)
Virtual Visit via Telephone Note  I connected with Rodney Morrison on 09/22/21 at  1:20 PM EDT by telephone and verified that I am speaking with the correct person using two identifiers.  Location: Patient: Home Provider: Hospital   I discussed the limitations, risks, security and privacy concerns of performing an evaluation and management service by telephone and the availability of in person appointments. I also discussed with the patient that there may be a patient responsible charge related to this service. The patient expressed understanding and agreed to proceed.   History of Present Illness: Patient has no specific new complaints except that he was unable to get his medicine filled because the prior authorization has run out and so he was not allowed to pick up the full dosage.  He is very anxious about this.  I called the pharmacy and confirmed that there does not seem to be an easy way around this except doing the prior authorization    Observations/Objective: Anxious and nervous not psychotic but verging on manic like symptoms   Assessment and Plan: I am going to go ahead and pursue doing the prior authorization follow-up otherwise as previously engaged   Follow Up Instructions:    I discussed the assessment and treatment plan with the patient. The patient was provided an opportunity to ask questions and all were answered. The patient agreed with the plan and demonstrated an understanding of the instructions.   The patient was advised to call back or seek an in-person evaluation if the symptoms worsen or if the condition fails to improve as anticipated.  I provided 5 minutes of non-face-to-face time during this encounter.   Alethia Berthold, MD

## 2021-10-15 ENCOUNTER — Other Ambulatory Visit: Payer: Self-pay | Admitting: Orthopedic Surgery

## 2021-10-15 DIAGNOSIS — M25512 Pain in left shoulder: Secondary | ICD-10-CM

## 2022-03-02 ENCOUNTER — Telehealth (INDEPENDENT_AMBULATORY_CARE_PROVIDER_SITE_OTHER): Payer: Medicare Other | Admitting: Psychiatry

## 2022-03-02 DIAGNOSIS — F3130 Bipolar disorder, current episode depressed, mild or moderate severity, unspecified: Secondary | ICD-10-CM | POA: Diagnosis not present

## 2022-03-02 MED ORDER — LAMOTRIGINE 100 MG PO TABS: 100.0000 mg | ORAL_TABLET | Freq: Two times a day (BID) | ORAL | 1 refills | Status: DC

## 2022-03-02 MED ORDER — OLANZAPINE 20 MG PO TABS: 40.0000 mg | ORAL_TABLET | Freq: Every day | ORAL | 5 refills | Status: DC

## 2022-03-02 NOTE — Progress Notes (Signed)
Virtual Visit via Telephone Note  I connected with Glean Salen on 03/02/22 at  1:00 PM EST by telephone and verified that I am speaking with the correct person using two identifiers.  Location: Patient: Home Provider: Hospital   I discussed the limitations, risks, security and privacy concerns of performing an evaluation and management service by telephone and the availability of in person appointments. I also discussed with the patient that there may be a patient responsible charge related to this service. The patient expressed understanding and agreed to proceed.   History of Present Illness: Patient reached by telephone for scheduled appointment.  Patient was expecting the call and was appropriate.  No new mental health concerns.  Chronic shoulder pain is worsening limiting his ability to do much.  Mood however is stable no return of any psychotic symptoms no side effects of medicine   Observations/Objective: Alert and oriented good insight and judgment.  Denies suicidal thoughts and no evidence of psychosis   Assessment and Plan: Reviewed medications and plan.  Continue current medications everything refilled can follow-up 6 months   Follow Up Instructions:    I discussed the assessment and treatment plan with the patient. The patient was provided an opportunity to ask questions and all were answered. The patient agreed with the plan and demonstrated an understanding of the instructions.   The patient was advised to call back or seek an in-person evaluation if the symptoms worsen or if the condition fails to improve as anticipated.  I provided 10 minutes of non-face-to-face time during this encounter.   Alethia Berthold, MD

## 2022-05-13 ENCOUNTER — Telehealth (INDEPENDENT_AMBULATORY_CARE_PROVIDER_SITE_OTHER): Payer: Medicare Other | Admitting: Psychiatry

## 2022-05-13 DIAGNOSIS — F5101 Primary insomnia: Secondary | ICD-10-CM | POA: Diagnosis not present

## 2022-05-13 DIAGNOSIS — F333 Major depressive disorder, recurrent, severe with psychotic symptoms: Secondary | ICD-10-CM

## 2022-05-13 MED ORDER — OLANZAPINE 20 MG PO TABS: 40.0000 mg | ORAL_TABLET | Freq: Every day | ORAL | 5 refills | Status: DC

## 2022-05-13 MED ORDER — LAMOTRIGINE 100 MG PO TABS: 100.0000 mg | ORAL_TABLET | Freq: Two times a day (BID) | ORAL | 1 refills | Status: DC

## 2022-05-13 NOTE — Progress Notes (Signed)
Virtual Visit via Telephone Note  I connected with Brain Hilts on 05/13/22 at  1:20 PM EDT by telephone and verified that I am speaking with the correct person using two identifiers.  Location: Patient: Home Provider: Hospital   I discussed the limitations, risks, security and privacy concerns of performing an evaluation and management service by telephone and the availability of in person appointments. I also discussed with the patient that there may be a patient responsible charge related to this service. The patient expressed understanding and agreed to proceed.   History of Present Illness: Patient reached by telephone.  52 year old man with longstanding well-controlled psychotic mood disorder.  No new complaints.  Has been taking medicine consistently.  No side effects.  Patient is aware that I am leaving the practice at the end of June and is working with the office on arranging follow-up.    Observations/Objective: Alert and oriented euthymic affect thoughts lucid no evidence of loosening of associations.  No evidence of disordered thinking.  Denies hallucinations.  Good insight and understanding of the use of medicine.  Assessment and Plan: Mr. Jiminez tells me that he is fed up with [facility] and does not think they have done the best job with his prescriptions and would like me to change them to the [facility] on [address].  That is fine with me.  I have put in prescriptions for 90 days and a refill so he has plenty of time to find another provider.   Follow Up Instructions:    I discussed the assessment and treatment plan with the patient. The patient was provided an opportunity to ask questions and all were answered. The patient agreed with the plan and demonstrated an understanding of the instructions.   The patient was advised to call back or seek an in-person evaluation if the symptoms worsen or if the condition fails to improve as anticipated.  I provided 20 minutes of  non-face-to-face time during this encounter.   Mordecai Rasmussen, MD

## 2022-10-22 NOTE — Progress Notes (Addendum)
Psychiatric Initial Adult Assessment   Patient Identification: Rodney Morrison MRN:  [id number] Date of Evaluation:  10/26/2022 Referral Source: Marguarite Arbour, MD  Chief Complaint:   Chief Complaint  Patient presents with   Establish Care   Visit Diagnosis:    ICD-10-CM   1. Mood disorder in conditions classified elsewhere  F06.30     2. High risk medication use  Z79.899 EKG 12-Lead      History of Present Illness:   Rodney Morrison is a 52 y.o. year old male with a history of bipolar disorder vs depression. The patient was transferred from Dr. Toni Amend. I conducted an extensive chart review. To ensure diagnostic accuracy and appropriate treatment, I performed a comprehensive evaluation as detailed below.  According to the chart review, the patient has been following the medication regimen prescribed by Dr. Toni Amend for the diagnosis of depression, (one time diagnosis of bipolar disorder). Lamotrigine 100 mg twice a day olanzapine 40 mg daily   - according to the chart written by Dr. Toni Amend 04/2017  "The patient's father dropped by the office today to tell me about her recent deterioration.  Apparently for several days now Rodney Morrison has been keeping himself in his bedroom 24 hours a day and talking about how radiation is getting to him.  He will not even go out in any light because of this delusion about radiation.  Father does not think that he is dangerous to himself there is no sign that he is doing anything to hurt himself.  Mother is pretty sure he is not using drugs and that he is still compliant with medicine.  Apparently Rodney Morrison is unwilling to talk on the telephone as well." Olanzapine was upttirated to 45 mg  He states that his great grandmother has manic depression, and his mother suffered from bipolar disorder.  He thinks he is  "chemically imbalanced" and struggles with depression since 27-year-old, although he has not received any treatment until 52 year old.  He believes  medication makes him normal.  He is "conditionally depressed" for the past 16 years due to back pain, s/p surgery.  It is debilitating, and he has difficult time doing yard work and driving.  Although he used to play golf and sports, he is unable to do those anymore.  He is trying to "play cards" and do what he can do.  He lives with his father.  He reports great relationship with him.  He has limited contact with his mother, who has issues with gambling.  He states that he tries to communicate through writing letters as she makes him anxious and angry.  He thinks he would not have any issues with his mood as long as he is on medication if he were to have no pain.  He feels a sense of drag in society and does not see himself as a productive member, referring to his unemployment. However, he needs an income to cover his father's needs and receives disability benefits.  Depression- The patient has mood symptoms as in PHQ-9/GAD-7.  He denies insomnia.  He has slight increase in appetite, and is hoping to lose his weight.  He enjoys fishing and springtime with his father.  He denies SI.   Bipolar-he denies any history of decreased need for sleep, euphoria, or increased goal directed activities.  He denies irritability, stating that he would smoke cigarette as he is tired of argument.   Memory-he thinks olanzapine is affecting his memory, although he wants to stay on  this medication.     Substance use  Tobacco Alcohol Other substances/  Current  denies denies  Past  denies Cocaine, last in 2008 ("never addicted"  Past Treatment        Support: father Household: father (with COPD, Afib, 48 yo) Marital status: single Number of children: 0  Employment: on disability, unemployed, used to work in [employer], Social research officer, government, carpentry Education: [employer] two years He was raised by his mother and his stepfather.  He reports good relationship with them as a child, and describes his stepfather as a good  guy.  Her mother is dominant, and his stepfather becomes more dominant when he earns more money.  His mother suffers from bipolar disorder, and later has issues with a gambling.  He states that his mother is good at manipulating a man.    98.9 kg (218 lb) 05/27/2022   Wt Readings from Last 3 Encounters:  10/26/22 222 lb 9.6 oz (101 kg)  02/09/18 213 lb 6.4 oz (96.8 kg)  03/15/17 233 lb 12.8 oz (106.1 kg)      Associated Signs/Symptoms: Depression Symptoms:  depressed mood, anhedonia, fatigue, (Hypo) Manic Symptoms:   denies decreased need for sleep, euphoria Anxiety Symptoms:   denies Psychotic Symptoms:   denies AH, VH, paranoia PTSD Symptoms: Negative  Past Psychiatric History:  Outpatient:  Psychiatry admission: [facility] in 2008 for depression, "frustration" with his life in the setting of cocaine use Previous suicide attempt: denies Past trials of medication: nortriptyline (worked very well) History of violence: denies History of head injury:   Previous Psychotropic Medications: Yes   Substance Abuse History in the last 12 months:  No.  Consequences of Substance Abuse: NA  Past Medical History:  Past Medical History:  Diagnosis Date   Anxiety    Depression     Past Surgical History:  Procedure Laterality Date   BACK SURGERY      Family Psychiatric History: as below  Family History:  Family History  Problem Relation Age of Onset   Bipolar disorder Mother    Drug abuse Cousin     Social History:   Social History   Socioeconomic History   Marital status: Single    Spouse name: Not on file   Number of children: 0   Years of education: Not on file   Highest education level: Some college, no degree  Occupational History    Comment: disabled  Tobacco Use   Smoking status: Every Day    Current packs/day: 1.00    Average packs/day: 1 pack/day for 38.3 years (38.3 ttl pk-yrs)    Types: Cigarettes    Start date: 07/22/1984   Smokeless tobacco: Never   Vaping Use   Vaping status: Never Used  Substance and Sexual Activity   Alcohol use: Not Currently    Alcohol/week: 1.0 standard drink of alcohol    Types: 1 Cans of beer per week   Drug use: No   Sexual activity: Not Currently  Other Topics Concern   Not on file  Social History Narrative   Not on file   Social Determinants of Health   Financial Resource Strain: Medium Risk (12/16/2016)   Overall Financial Resource Strain (CARDIA)    Difficulty of Paying Living Expenses: Somewhat hard  Food Insecurity: No Food Insecurity (12/16/2016)   Hunger Vital Sign    Worried About Running Out of Food in the Last Year: Never true    Ran Out of Food in the Last Year: Never true  Transportation Needs: No Transportation Needs (12/16/2016)   PRAPARE - Administrator, Civil Service (Medical): No    Lack of Transportation (Non-Medical): No  Physical Activity: Sufficiently Active (12/16/2016)   Exercise Vital Sign    Days of Exercise per Week: 5 days    Minutes of Exercise per Session: 30 min  Stress: No Stress Concern Present (12/16/2016)   [employer] of Occupational Health - Occupational Stress Questionnaire    Feeling of Stress : Not at all  Social Connections: Moderately Isolated (12/16/2016)   Social Connection and Isolation Panel [NHANES]    Frequency of Communication with Friends and Family: More than three times a week    Frequency of Social Gatherings with Friends and Family: Twice a week    Attends Religious Services: Never    Database administrator or Organizations: No    Attends Engineer, structural: Never    Marital Status: Never married    Additional Social History: as above  Allergies:  No Known Allergies  Metabolic Disorder Labs: No results found for: "HGBA1C", "MPG" No results found for: "PROLACTIN" No results found for: "CHOL", "TRIG", "HDL", "CHOLHDL", "VLDL", "LDLCALC" No results found for: "TSH"  Therapeutic Level Labs: No results found  for: "LITHIUM" No results found for: "CBMZ" No results found for: "VALPROATE"  Current Medications: Current Outpatient Medications  Medication Sig Dispense Refill   ibuprofen (ADVIL) 200 MG tablet Take by mouth once.     oxyCODONE-acetaminophen (PERCOCET/ROXICET) 5-325 MG tablet Take 1 tablet by mouth every 6 (six) hours as needed.     lamoTRIgine (LAMICTAL) 100 MG tablet Take 1 tablet (100 mg total) by mouth 2 (two) times daily. 180 tablet 1   OLANZapine (ZYPREXA) 20 MG tablet Take 2 tablets (40 mg total) by mouth at bedtime. 180 tablet 0   No current facility-administered medications for this visit.    Musculoskeletal: Strength & Muscle Tone: within normal limits Gait & Station: normal Patient leans: N/A  Psychiatric Specialty Exam: Review of Systems  Psychiatric/Behavioral:  Positive for decreased concentration. Negative for agitation, behavioral problems, confusion, dysphoric mood, hallucinations, self-injury, sleep disturbance and suicidal ideas. The patient is not nervous/anxious and is not hyperactive.   All other systems reviewed and are negative.   Blood pressure (!) 161/96, pulse 80, temperature 98.1 F (36.7 C), temperature source Skin, height 6\' 2"  (1.88 m), weight 222 lb 9.6 oz (101 kg).Body mass index is 28.58 kg/m.  General Appearance: Well Groomed  Eye Contact:  Good  Speech:  Clear and Coherent  Volume:  Normal  Mood:  Depressed  Affect:  Appropriate, Congruent, and slightly grimace due to pain, but reactive  Thought Process:  Coherent  Orientation:  Full (Time, Place, and Person)  Thought Content:  Logical  Suicidal Thoughts:  No  Homicidal Thoughts:  No  Memory:  Immediate;   Good  Judgement:  Good  Insight:  Good  Psychomotor Activity:  Normal  Concentration:  Concentration: Good and Attention Span: Good  Recall:  Good  Fund of Knowledge:Good  Language: Good  Akathisia:  No  Handed:  Right  AIMS (if indicated):  not done  Assets:  Communication  Skills Desire for Improvement  ADL's:  Intact  Cognition: WNL  Sleep:  Good   Screenings:   Assessment and Plan:  Rodney Morrison is a 52 y.o. year old male with a history of bipolar disorder vs depression. The patient was transferred from Dr. Toni Amend.  1. Mood disorder in conditions  classified elsewhere Acute stressors include:  Other stressors include: back pain, unemployment, his mother with bipolar disorder, gambling    History:Tx from Dr. Toni Amend under bipolar vs depression. Depressive symptoms since age 58 . He has a family history of bipolar d/o, although he denies any manic symptoms. Had episode of delusion about radiation in 2019, where olanzapine was uptitrated. Admitted to [facility] due to depression in the context of cocaine use. originally on Lamotrigine 100 mg twice a day olanzapine 40 mg daily  He reports "conditional" depressive symptoms in the context of stressors as above.  He thinks he has mood symptoms will be improved if he were not to have any pain.  Noted that although there is a chart diagnosis of bipolar disorder, he denies any mania or hypomania in the past.  Will continue to assess..  Although there is concern about an increase in appetite and memory loss due to olanzapine, he is not requesting any adjustments to his medication at this time, as he has been stable on it for many years. Noted that the chart review indicates he had a history of delusion and depression when he was on olanzapine 30 mg. Will continue current dose of lamotrigine to target mood dysregulation along with olanzapine for mood dysregulation,  depression.   2. High risk medication use Obtain EKG to monitor QTc to monitor QTc prolongation   Plan Continue Lamotrigine 100 mg twice a day  Continue olanzapine 40 mg daily  Next appointment- 12/16 at 3 pm. IP Obtain EKG  The patient demonstrates the following risk factors for suicide: Chronic risk factors for suicide include: psychiatric disorder of  depression and substance use disorder. Acute risk factors for suicide include: unemployment. Protective factors for this patient include: positive social support, responsibility to others (children, family), coping skills, and hope for the future. Considering these factors, the overall suicide risk at this point appears to be low. Patient is appropriate for outpatient follow up.   Collaboration of Care: Other reviewed notes in Epic  Patient/Guardian was advised Release of Information must be obtained prior to any record release in order to collaborate their care with an outside provider. Patient/Guardian was advised if they have not already done so to contact the registration department to sign all necessary forms in order for [place] to release information regarding their care.   Consent: Patient/Guardian gives verbal consent for treatment and assignment of benefits for services provided during this visit. Patient/Guardian expressed understanding and agreed to proceed.   The duration of the time spent on the following activities on the date of the encounter was 60 minutes.   Preparing to see the patient (e.g., review of test, records)  Obtaining and/or reviewing separately obtained history  Performing a medically necessary exam and/or evaluation  Counseling and educating the patient/family/caregiver  Ordering medications, tests, or procedures  Referring and communicating with other healthcare professionals (when not reported separately)  Documenting clinical information in the electronic or paper health record  Independently interpreting results of tests/labs and communication of results to the family or caregiver  Care coordination (when not reported separately)   Neysa Hotter, MD 10/15/20243:39 PM

## 2022-10-26 ENCOUNTER — Ambulatory Visit: Payer: Medicare Other | Admitting: Psychiatry

## 2022-10-26 ENCOUNTER — Encounter: Payer: Self-pay | Admitting: Psychiatry

## 2022-10-26 VITALS — BP 161/96 | HR 80 | Temp 98.1°F | Ht 74.0 in | Wt 222.6 lb

## 2022-10-26 DIAGNOSIS — Z79899 Other long term (current) drug therapy: Secondary | ICD-10-CM

## 2022-10-26 DIAGNOSIS — F0630 Mood disorder due to known physiological condition, unspecified: Secondary | ICD-10-CM | POA: Diagnosis not present

## 2022-10-26 MED ORDER — LAMOTRIGINE 100 MG PO TABS: 100.0000 mg | ORAL_TABLET | Freq: Two times a day (BID) | ORAL | 1 refills | Status: DC

## 2022-10-26 MED ORDER — OLANZAPINE 20 MG PO TABS: 40.0000 mg | ORAL_TABLET | Freq: Every day | ORAL | 0 refills | Status: DC

## 2022-11-03 ENCOUNTER — Telehealth: Payer: Self-pay | Admitting: Psychiatry

## 2022-11-03 NOTE — Telephone Encounter (Signed)
Made several attempts to call patient received busy signal each time will try later today to call patient again

## 2022-11-03 NOTE — Telephone Encounter (Signed)
Please advise him that the olanzapine was filled with enough to last until his next visit. This medication requires monitoring/evaluation before it can be prescribed for six months. It seems the patient has not yet completed the EKG we discussed at his last visit. Could you remind him to have the EKG done? Please let me know if there are any other concerns.

## 2022-11-03 NOTE — Telephone Encounter (Signed)
Called patient back was given the message that he was sleeping and I could call back after 5 made person aware that I would call the patient back in the morning he voiced understanding

## 2022-11-03 NOTE — Telephone Encounter (Signed)
Patient came to office states he received a partial refill for Olanzapine 20 mg and does not understand why it is not 5 refills. He will go by pharmacy to verify but wanted to stop here first. Please advise.

## 2022-11-04 NOTE — Telephone Encounter (Signed)
Called patient no answer left voicemail for patient to return call to office 

## 2022-11-05 NOTE — Telephone Encounter (Signed)
Called patient again no answer left voicemail for patient to return call to office

## 2022-12-24 NOTE — Progress Notes (Unsigned)
BH MD/PA/NP OP Progress Note  12/27/2022 3:46 PM MCCARTNEY MONTOUR  MRN:  [id number]  Chief Complaint:  Chief Complaint  Patient presents with   Follow-up   This is a follow-up appointment for mood disorder.  He states that his mood has been better.  He describes her mother as a monitor.  She loaned 75 thousand, and had another loan, and asked him for money.  He told her that he is done.  He has not talked with her for the past week.  It is misery, and he thinks everybody is depressed around her.  Although he feels guilty of not calling, he thinks his mood has been better.  He has been smoking, drinking coffee out of boredom.  He is aware that he needs to stop especially given his hypertension.  He is unable to do exercise due to knee and back pain, although he used to be always active.  He sleeps up to 6 hours, although he had insomnia last night.  He tends to feel nervous with somebody new.  He is hoping to work on diet.  He denies SI.  He denies decreased need for sleep or euphoria.  He does not see the need to see a therapist, and would like to work on his own.  He believes that he needs to stay on the medication as they are, and that they have been working good.   Fluctuates between 210-230 lbs  Wt Readings from Last 3 Encounters:  12/27/22 228 lb (103.4 kg)  10/26/22 222 lb 9.6 oz (101 kg)  02/09/18 213 lb 6.4 oz (96.8 kg)     Substance use   Tobacco Alcohol Other substances/  Current  2 PPD denies Denies (four cups of coffee, two pepsi to feel "high" when he is bored)  Past   denies Cocaine, last in 2008 ("never addicted"  Past Treatment  bupropion, nicotine patch, varenicline          Support: father Household: father (with COPD, Afib, 60 yo) Marital status: single Number of children: 0  Employment: on disability, unemployed, used to work in [employer], Social research officer, government, carpentry Education: [employer] two years He was raised by his mother and his stepfather.  He reports good  relationship with them as a child, and describes his stepfather as a good guy.  Her mother is dominant, and his stepfather becomes more dominant when he earns more money.  His mother suffers from bipolar disorder, and later has issues with a gambling.  He states that his mother is good at manipulating a man.   Visit Diagnosis:    ICD-10-CM   1. Mood disorder in conditions classified elsewhere  F06.30     2. High risk medication use  Z79.899 EKG 12-Lead    3. Essential hypertension  I10       Past Psychiatric History: Please see initial evaluation for full details. I have reviewed the history. No updates at this time.     Past Medical History:  Past Medical History:  Diagnosis Date   Anxiety    Depression     Past Surgical History:  Procedure Laterality Date   BACK SURGERY      Family Psychiatric History: Please see initial evaluation for full details. I have reviewed the history. No updates at this time.     Family History:  Family History  Problem Relation Age of Onset   Bipolar disorder Mother    Drug abuse Cousin     Social History:  Social History   Socioeconomic History   Marital status: Single    Spouse name: Not on file   Number of children: 0   Years of education: Not on file   Highest education level: Some college, no degree  Occupational History    Comment: disabled  Tobacco Use   Smoking status: Every Day    Current packs/day: 1.00    Average packs/day: 1 pack/day for 38.4 years (38.4 ttl pk-yrs)    Types: Cigarettes    Start date: 07/22/1984   Smokeless tobacco: Never  Vaping Use   Vaping status: Never Used  Substance and Sexual Activity   Alcohol use: Not Currently    Alcohol/week: 1.0 standard drink of alcohol    Types: 1 Cans of beer per week   Drug use: No   Sexual activity: Not Currently  Other Topics Concern   Not on file  Social History Narrative   Not on file   Social Drivers of Health   Financial Resource Strain: Medium Risk  (12/16/2016)   Overall Financial Resource Strain (CARDIA)    Difficulty of Paying Living Expenses: Somewhat hard  Food Insecurity: No Food Insecurity (12/16/2016)   Hunger Vital Sign    Worried About Running Out of Food in the Last Year: Never true    Ran Out of Food in the Last Year: Never true  Transportation Needs: No Transportation Needs (12/16/2016)   PRAPARE - Administrator, Civil Service (Medical): No    Lack of Transportation (Non-Medical): No  Physical Activity: Sufficiently Active (12/16/2016)   Exercise Vital Sign    Days of Exercise per Week: 5 days    Minutes of Exercise per Session: 30 min  Stress: No Stress Concern Present (12/16/2016)   [employer] of Occupational Health - Occupational Stress Questionnaire    Feeling of Stress : Not at all  Social Connections: Moderately Isolated (12/16/2016)   Social Connection and Isolation Panel [NHANES]    Frequency of Communication with Friends and Family: More than three times a week    Frequency of Social Gatherings with Friends and Family: Twice a week    Attends Religious Services: Never    Database administrator or Organizations: No    Attends Engineer, structural: Never    Marital Status: Never married    Allergies: No Known Allergies  Metabolic Disorder Labs: No results found for: "HGBA1C", "MPG" No results found for: "PROLACTIN" No results found for: "CHOL", "TRIG", "HDL", "CHOLHDL", "VLDL", "LDLCALC" No results found for: "TSH"  Therapeutic Level Labs: No results found for: "LITHIUM" No results found for: "VALPROATE" No results found for: "CBMZ"  Current Medications: Current Outpatient Medications  Medication Sig Dispense Refill   amLODipine (NORVASC) 5 MG tablet Take 5 mg by mouth daily.     ibuprofen (ADVIL) 200 MG tablet Take by mouth once.     lamoTRIgine (LAMICTAL) 100 MG tablet Take 1 tablet (100 mg total) by mouth 2 (two) times daily. 180 tablet 1   OLANZapine (ZYPREXA) 20 MG  tablet Take 2 tablets (40 mg total) by mouth at bedtime. 180 tablet 0   oxyCODONE-acetaminophen (PERCOCET/ROXICET) 5-325 MG tablet Take 1 tablet by mouth every 6 (six) hours as needed.     No current facility-administered medications for this visit.     Musculoskeletal: Strength & Muscle Tone: within normal limits Gait & Station: normal Patient leans: N/A  Psychiatric Specialty Exam: Review of Systems  Psychiatric/Behavioral:  Positive for dysphoric mood and sleep  disturbance. Negative for agitation, behavioral problems, confusion, decreased concentration, hallucinations, self-injury and suicidal ideas. The patient is nervous/anxious. The patient is not hyperactive.   All other systems reviewed and are negative.   Blood pressure (!) 177/109, pulse 82, temperature 97.7 F (36.5 C), temperature source Skin, height 6\' 2"  (1.88 m), weight 228 lb (103.4 kg).Body mass index is 29.27 kg/m.  General Appearance: Well Groomed  Eye Contact:  Good  Speech:  Clear and Coherent  Volume:  Normal  Mood:   better  Affect:  Appropriate, Congruent, and calm  Thought Process:  Coherent  Orientation:  Full (Time, Place, and Person)  Thought Content: Logical   Suicidal Thoughts:  No  Homicidal Thoughts:  No  Memory:  Immediate;   Good  Judgement:  Good  Insight:  Good  Psychomotor Activity:  Normal  Concentration:  Concentration: Good and Attention Span: Good  Recall:  Good  Fund of Knowledge: Good  Language: Good  Akathisia:  No  Handed:  Right  AIMS (if indicated): not done  Assets:  Communication Skills Desire for Improvement  ADL's:  Intact  Cognition: WNL  Sleep:  Fair   Screenings: GAD-7    Flowsheet Row Office Visit from 12/27/2022 in [facility] Office Visit from 10/26/2022 in [facility]  Total GAD-7 Score 4 4      PHQ2-9    Flowsheet Row Office Visit from 12/27/2022 in [facility] Office Visit from 10/26/2022 in [facility]  PHQ-2 Total Score 2 3  PHQ-9 Total Score 2 5        Assessment and Plan:  NEQUAN FRANCHINA is a 52 y.o. year old male with a history of bipolar disorder vs depression. The patient was transferred from Dr. Toni Amend.  1. Mood disorder in conditions classified elsewhere Acute stressors include: conflict with his mother  Other stressors include: back pain, unemployment, his mother with bipolar disorder, gambling    History:Tx from Dr. Toni Amend under bipolar vs depression. Depressive symptoms since age 31 . He has a family history of bipolar d/o, although he denies any manic symptoms. Had episode of delusion about radiation in 2019, and olanzapine was uptitrated from 30 mg. Admitted to [facility] due to depression in the context of cocaine use. originally on Lamotrigine 100 mg twice a day olanzapine 40 mg daily  Exam is notable for euthymic affect, polite manner , and he reports mild improvement in depressive symptoms since making and anxiety between his mother, who is into gambling.  He reports relatively stable mood on the current medication regimen, and has strong preference to stay as it is.  He is not interested in adding metformin either despite weight gain, which may be attributable to olanzapine, while he is willing to work on healthy diet and exercise.  Will continue current dose of olanzapine and lamotrigine to target mood dysregulation and depression. Noted that although there is a chart diagnosis of bipolar disorder, he denies any mania or hypomania in the past.  Will continue to assess..  Noted that although he will benefit from CBT especially given the current stressor, he would like to work on by himself for now    2. High risk medication use He was advised again to obtain EKG to monitor QTc prolongation.   3. Essential hypertension Although he was prescribed Norvasc according  to the chart at his recent PCP visit, he discontinued due to side  effects.  He was advised to discuss this with his primary care given ongoing hypertension. He is also advised to reduce caffeine intake.   Continue Lamotrigine 100 mg twice a day  Continue olanzapine 40 mg daily - monitor weight gain Next appointment- 2/6 at 4:30, IP Obtain EKG - please call [phone] or [phone] to make an appointment     The patient demonstrates the following risk factors for suicide: Chronic risk factors for suicide include: psychiatric disorder of depression and substance use disorder. Acute risk factors for suicide include: unemployment. Protective factors for this patient include: positive social support, responsibility to others (children, family), coping skills, and hope for the future. Considering these factors, the overall suicide risk at this point appears to be low. Patient is appropriate for outpatient follow up.     Collaboration of Care: Collaboration of Care: Other reviewed notes in Epic  Patient/Guardian was advised Release of Information must be obtained prior to any record release in order to collaborate their care with an outside provider. Patient/Guardian was advised if they have not already done so to contact the registration department to sign all necessary forms in order for [place] to release information regarding their care.   Consent: Patient/Guardian gives verbal consent for treatment and assignment of benefits for services provided during this visit. Patient/Guardian expressed understanding and agreed to proceed.   The duration of the time spent on the following activities on the date of the encounter was 30 minutes.   Preparing to see the patient (e.g., review of test, records)  Obtaining and/or reviewing separately obtained history  Performing a medically necessary exam and/or evaluation  Counseling and educating the patient/family/caregiver  Ordering medications, tests, or  procedures  Referring and communicating with other healthcare professionals (when not reported separately)  Documenting clinical information in the electronic or paper health record  Independently interpreting results of tests/labs and communication of results to the family or caregiver  Care coordination (when not reported separately)   Neysa Hotter, MD 12/27/2022, 3:46 PM

## 2022-12-27 ENCOUNTER — Encounter: Payer: Self-pay | Admitting: Psychiatry

## 2022-12-27 ENCOUNTER — Ambulatory Visit: Payer: Medicare Other | Admitting: Psychiatry

## 2022-12-27 VITALS — BP 177/109 | HR 82 | Temp 97.7°F | Ht 74.0 in | Wt 228.0 lb

## 2022-12-27 DIAGNOSIS — I10 Essential (primary) hypertension: Secondary | ICD-10-CM | POA: Diagnosis not present

## 2022-12-27 DIAGNOSIS — F0630 Mood disorder due to known physiological condition, unspecified: Secondary | ICD-10-CM | POA: Diagnosis not present

## 2022-12-27 DIAGNOSIS — Z79899 Other long term (current) drug therapy: Secondary | ICD-10-CM

## 2022-12-27 NOTE — Patient Instructions (Signed)
Continue Lamotrigine 100 mg twice a day  Continue olanzapine 40 mg daily  Next appointment- 2/6 at 4:30, IP Obtain EKG - please call [phone]  to make an appointment

## 2023-01-18 ENCOUNTER — Other Ambulatory Visit: Payer: Self-pay | Admitting: Psychiatry

## 2023-01-20 ENCOUNTER — Other Ambulatory Visit: Payer: Self-pay | Admitting: Psychiatry

## 2023-01-20 ENCOUNTER — Telehealth: Payer: Self-pay | Admitting: Psychiatry

## 2023-01-20 MED ORDER — OLANZAPINE 20 MG PO TABS: 40.0000 mg | ORAL_TABLET | Freq: Every day | ORAL | 0 refills | Status: DC

## 2023-01-20 NOTE — Telephone Encounter (Signed)
 Patient stopped by the office stating he needs a refill on the Olanzapine 20 mg and needs it for 3 months. Please review and advise

## 2023-01-20 NOTE — Telephone Encounter (Signed)
 left message that rx has been sent to the pharmacy

## 2023-01-20 NOTE — Telephone Encounter (Signed)
 Ordered

## 2023-01-25 ENCOUNTER — Other Ambulatory Visit: Payer: Self-pay | Admitting: Psychiatry

## 2023-01-25 ENCOUNTER — Telehealth: Payer: Self-pay

## 2023-01-25 NOTE — Telephone Encounter (Signed)
 went online to [email or web address] and submitted the prior auth . - pending

## 2023-01-25 NOTE — Telephone Encounter (Signed)
received fax that a prior auth was needed for the olanzapine. ?

## 2023-01-26 ENCOUNTER — Other Ambulatory Visit: Payer: Self-pay | Admitting: Psychiatry

## 2023-01-26 MED ORDER — OLANZAPINE 20 MG PO TABS: 20.0000 mg | ORAL_TABLET | Freq: Two times a day (BID) | ORAL | 0 refills | Status: DC

## 2023-01-26 NOTE — Telephone Encounter (Signed)
 Could you see if there is an option to take 20 mg tab twice a day?

## 2023-01-26 NOTE — Telephone Encounter (Signed)
 per pharmacy they don't have a a list of what is covered and what is not covered. you would have to either cut pt to one tablet or put on something else.

## 2023-01-26 NOTE — Telephone Encounter (Signed)
 the olazapine 20mg  was denied- over the quantity limited requiredment 1 tablet per day is allowed.

## 2023-01-26 NOTE — Telephone Encounter (Signed)
 Olanzapine  20 mg twice a day has been ordered to check if it will be covered. Please notify the patient about this update.

## 2023-01-27 NOTE — Telephone Encounter (Signed)
pharamcy called states you sent the same rx just written differently but insurance will not pay for more than 1 pill a day. olanzapine 20mg 

## 2023-01-27 NOTE — Telephone Encounter (Signed)
Could you inform the patient that his medication is no longer covered by his insurance? He might be able to purchase it at a lower cost at [place], as it is on the $4 list. Please let me know if the patient is willing to try obtaining the medication from that pharmacy.

## 2023-01-29 NOTE — Progress Notes (Deleted)
BH MD/PA/NP OP Progress Note  01/29/2023 3:52 PM Rodney Morrison  MRN:  [id number]  Chief Complaint: No chief complaint on file.  HPI: ***   Substance use   Tobacco Alcohol Other substances/  Current  2 PPD denies Denies (four cups of coffee, two pepsi to feel "high" when he is bored)  Past   denies Cocaine, last in 2008 ("never addicted"  Past Treatment  bupropion, nicotine patch, varenicline          Support: father Household: father (with COPD, Afib, 31 yo) Marital status: single Number of children: 0  Employment: on disability, unemployed, used to work in [employer], Social research officer, government, carpentry Education: [employer] two years He was raised by his mother and his stepfather.  He reports good relationship with them as a child, and describes his stepfather as a good guy.  Her mother is dominant, and his stepfather becomes more dominant when he earns more money.  His mother suffers from bipolar disorder, and later has issues with a gambling.  He states that his mother is good at manipulating a man.   Visit Diagnosis: No diagnosis found.  Past Psychiatric History: Please see initial evaluation for full details. I have reviewed the history. No updates at this time.     Past Medical History:  Past Medical History:  Diagnosis Date   Anxiety    Depression     Past Surgical History:  Procedure Laterality Date   BACK SURGERY      Family Psychiatric History: Please see initial evaluation for full details. I have reviewed the history. No updates at this time.     Family History:  Family History  Problem Relation Age of Onset   Bipolar disorder Mother    Drug abuse Cousin     Social History:  Social History   Socioeconomic History   Marital status: Single    Spouse name: Not on file   Number of children: 0   Years of education: Not on file   Highest education level: Some college, no degree  Occupational History    Comment: disabled  Tobacco Use   Smoking status: Every  Day    Current packs/day: 1.00    Average packs/day: 1 pack/day for 38.5 years (38.5 ttl pk-yrs)    Types: Cigarettes    Start date: 07/22/1984   Smokeless tobacco: Never  Vaping Use   Vaping status: Never Used  Substance and Sexual Activity   Alcohol use: Not Currently    Alcohol/week: 1.0 standard drink of alcohol    Types: 1 Cans of beer per week   Drug use: No   Sexual activity: Not Currently  Other Topics Concern   Not on file  Social History Narrative   Not on file   Social Drivers of Health   Financial Resource Strain: Medium Risk (12/16/2016)   Overall Financial Resource Strain (CARDIA)    Difficulty of Paying Living Expenses: Somewhat hard  Food Insecurity: No Food Insecurity (12/16/2016)   Hunger Vital Sign    Worried About Running Out of Food in the Last Year: Never true    Ran Out of Food in the Last Year: Never true  Transportation Needs: No Transportation Needs (12/16/2016)   PRAPARE - Administrator, Civil Service (Medical): No    Lack of Transportation (Non-Medical): No  Physical Activity: Sufficiently Active (12/16/2016)   Exercise Vital Sign    Days of Exercise per Week: 5 days    Minutes of Exercise per  Session: 30 min  Stress: No Stress Concern Present (12/16/2016)   [employer] of Occupational Health - Occupational Stress Questionnaire    Feeling of Stress : Not at all  Social Connections: Moderately Isolated (12/16/2016)   Social Connection and Isolation Panel [NHANES]    Frequency of Communication with Friends and Family: More than three times a week    Frequency of Social Gatherings with Friends and Family: Twice a week    Attends Religious Services: Never    Database administrator or Organizations: No    Attends Engineer, structural: Never    Marital Status: Never married    Allergies: No Known Allergies  Metabolic Disorder Labs: No results found for: "HGBA1C", "MPG" No results found for: "PROLACTIN" No results found  for: "CHOL", "TRIG", "HDL", "CHOLHDL", "VLDL", "LDLCALC" No results found for: "TSH"  Therapeutic Level Labs: No results found for: "LITHIUM" No results found for: "VALPROATE" No results found for: "CBMZ"  Current Medications: Current Outpatient Medications  Medication Sig Dispense Refill   amLODipine (NORVASC) 5 MG tablet Take 5 mg by mouth daily.     ibuprofen (ADVIL) 200 MG tablet Take by mouth once.     lamoTRIgine (LAMICTAL) 100 MG tablet Take 1 tablet (100 mg total) by mouth 2 (two) times daily. 180 tablet 1   OLANZapine (ZYPREXA) 20 MG tablet Take 2 tablets (40 mg total) by mouth at bedtime. 180 tablet 0   OLANZapine (ZYPREXA) 20 MG tablet Take 1 tablet (20 mg total) by mouth 2 (two) times daily. 180 tablet 0   oxyCODONE-acetaminophen (PERCOCET/ROXICET) 5-325 MG tablet Take 1 tablet by mouth every 6 (six) hours as needed.     No current facility-administered medications for this visit.     Musculoskeletal: Strength & Muscle Tone: within normal limits Gait & Station: normal Patient leans: N/A  Psychiatric Specialty Exam: Review of Systems  There were no vitals taken for this visit.There is no height or weight on file to calculate BMI.  General Appearance: {Appearance:22683}  Eye Contact:  {BHH EYE CONTACT:22684}  Speech:  Clear and Coherent  Volume:  Normal  Mood:  {BHH MOOD:22306}  Affect:  {Affect (PAA):22687}  Thought Process:  Coherent  Orientation:  Full (Time, Place, and Person)  Thought Content: Logical   Suicidal Thoughts:  {ST/HT (PAA):22692}  Homicidal Thoughts:  {ST/HT (PAA):22692}  Memory:  Immediate;   Good  Judgement:  {Judgement (PAA):22694}  Insight:  {Insight (PAA):22695}  Psychomotor Activity:  Normal  Concentration:  Concentration: Good and Attention Span: Good  Recall:  Good  Fund of Knowledge: Good  Language: Good  Akathisia:  No  Handed:  Right  AIMS (if indicated): not done  Assets:  Communication Skills Desire for Improvement  ADL's:   Intact  Cognition: WNL  Sleep:  {BHH GOOD/FAIR/POOR:22877}   Screenings: GAD-7    Flowsheet Row Office Visit from 12/27/2022 in [facility] Office Visit from 10/26/2022 in [facility]  Total GAD-7 Score 4 4      PHQ2-9    Flowsheet Row Office Visit from 12/27/2022 in [facility] Office Visit from 10/26/2022 in [facility]  PHQ-2 Total Score 2 3  PHQ-9 Total Score 2 5        Assessment and Plan:  Rodney Morrison is a 53 y.o. year old male with a history of bipolar disorder vs depression. The patient was transferred from Dr. Toni Amend.  1. Mood disorder in conditions classified elsewhere Acute stressors include: conflict with his mother  Other stressors include: back pain, unemployment, his mother with bipolar disorder, gambling    History:Tx from Dr. Toni Amend under bipolar vs depression. Depressive symptoms since age 33 . He has a family history of bipolar d/o, although he denies any manic symptoms. Had episode of delusion about radiation in 2019, and olanzapine was uptitrated from 30 mg. Admitted to [facility] due to depression in the context of cocaine use. originally on Lamotrigine 100 mg twice a day olanzapine 40 mg daily  Exam is notable for euthymic affect, polite manner , and he reports mild improvement in depressive symptoms since making and anxiety between his mother, who is into gambling.  He reports relatively stable mood on the current medication regimen, and has strong preference to stay as it is.  He is not interested in adding metformin either despite weight gain, which may be attributable to olanzapine, while he is willing to work on healthy diet and exercise.  Will continue current dose of olanzapine and lamotrigine to target mood dysregulation and depression. Noted that although there is a chart diagnosis of bipolar  disorder, he denies any mania or hypomania in the past.  Will continue to assess..  Noted that although he will benefit from CBT especially given the current stressor, he would like to work on by himself for now     2. High risk medication use He was advised again to obtain EKG to monitor QTc prolongation.    3. Essential hypertension Although he was prescribed Norvasc according to the chart at his recent PCP visit, he discontinued due to side effects.  He was advised to discuss this with his primary care given ongoing hypertension. He is also advised to reduce caffeine intake.   Continue Lamotrigine 100 mg twice a day  Continue olanzapine 40 mg daily - monitor weight gain Next appointment- 2/6 at 4:30, IP Obtain EKG - please call [phone] or [phone] to make an appointment      The patient demonstrates the following risk factors for suicide: Chronic risk factors for suicide include: psychiatric disorder of depression and substance use disorder. Acute risk factors for suicide include: unemployment. Protective factors for this patient include: positive social support, responsibility to others (children, family), coping skills, and hope for the future. Considering these factors, the overall suicide risk at this point appears to be low. Patient is appropriate for outpatient follow up.     Collaboration of Care: Collaboration of Care: {BH OP Collaboration of Care:21014065}  Patient/Guardian was advised Release of Information must be obtained prior to any record release in order to collaborate their care with an outside provider. Patient/Guardian was advised if they have not already done so to contact the registration department to sign all necessary forms in order for [place] to release information regarding their care.   Consent: Patient/Guardian gives verbal consent for treatment and assignment of benefits for services provided during this visit. Patient/Guardian expressed understanding and  agreed to proceed.    Neysa Hotter, MD 01/29/2023, 3:52 PM

## 2023-02-01 ENCOUNTER — Ambulatory Visit: Payer: Medicare Other | Admitting: Psychiatry

## 2023-02-03 ENCOUNTER — Encounter: Payer: Self-pay | Admitting: Psychiatry

## 2023-02-03 ENCOUNTER — Ambulatory Visit: Payer: Medicare Other | Admitting: Psychiatry

## 2023-02-03 VITALS — BP 191/108 | HR 81 | Temp 98.0°F | Ht 74.0 in | Wt 226.0 lb

## 2023-02-03 DIAGNOSIS — F0630 Mood disorder due to known physiological condition, unspecified: Secondary | ICD-10-CM | POA: Diagnosis not present

## 2023-02-03 DIAGNOSIS — I10 Essential (primary) hypertension: Secondary | ICD-10-CM

## 2023-02-03 DIAGNOSIS — Z79899 Other long term (current) drug therapy: Secondary | ICD-10-CM

## 2023-02-03 MED ORDER — OLANZAPINE 20 MG PO TABS: 20.0000 mg | ORAL_TABLET | Freq: Two times a day (BID) | ORAL | 0 refills | Status: DC

## 2023-02-03 NOTE — Progress Notes (Signed)
BH MD/PA/NP OP Progress Note  02/03/2023 3:07 PM Rodney Morrison  MRN:  [id number]  Chief Complaint:  Chief Complaint  Patient presents with   Follow-up   HPI:  This is a follow-up appointment for mood disorder.  He states that he is scared.  He received a letter about olanzapine being denied.  He states that he does not do well at the hal dose.  He still has some supply left, and he has not run out of the medication, although he is scared.  He states that he has chemical imbalances, which traces out to his grandmother.  He thinks he is doing good otherwise.  He has an uncle in [place], please see hospice.  He also reports stress about his mother.  He has limited mobility due to pain in his knee, and shoulder.  He also thinks he has some memory issues, being on the current medication, although he wants to stay on the medication.  He sleeps well.  He thinks he needs to work on his mood as it is "my problem."  He denies SI.  He denies hallucinations.  He denies decreased need for sleep or euphoria.  She had insomnia last week due to concern about the medication. We discussed the [facility] drug list and GoodRx as potential options where he might be able to obtain his medication at a more reasonable price.  He agrees with the plan as outlined below.    Wt Readings from Last 3 Encounters:  02/03/23 226 lb (102.5 kg)  12/27/22 228 lb (103.4 kg)  10/26/22 222 lb 9.6 oz (101 kg)     Substance use   Tobacco Alcohol Other substances/  Current  2 PPD denies Denies (four cups of coffee, two pepsi to feel "high" when he is bored)  Past   denies Cocaine, last in 2008 ("never addicted"  Past Treatment  bupropion, nicotine patch, varenicline          Support: father Household: father (with COPD, Afib, 65 yo) Marital status: single Number of children: 0  Employment: on disability, unemployed, used to work in [employer], Social research officer, government, carpentry Education: [employer] two years He was raised by his  mother and his stepfather.  He reports good relationship with them as a child, and describes his stepfather as a good guy.  Her mother is dominant, and his stepfather becomes more dominant when he earns more money.  His mother suffers from bipolar disorder, and later has issues with a gambling.  He states that his mother is good at manipulating a man.   Visit Diagnosis: No diagnosis found.  Past Psychiatric History: Please see initial evaluation for full details. I have reviewed the history. No updates at this time.     Past Medical History:  Past Medical History:  Diagnosis Date   Anxiety    Depression     Past Surgical History:  Procedure Laterality Date   BACK SURGERY      Family Psychiatric History: Please see initial evaluation for full details. I have reviewed the history. No updates at this time.     Family History:  Family History  Problem Relation Age of Onset   Bipolar disorder Mother    Drug abuse Cousin     Social History:  Social History   Socioeconomic History   Marital status: Single    Spouse name: Not on file   Number of children: 0   Years of education: Not on file   Highest education  level: Some college, no degree  Occupational History    Comment: disabled  Tobacco Use   Smoking status: Every Day    Current packs/day: 1.00    Average packs/day: 1 pack/day for 38.5 years (38.5 ttl pk-yrs)    Types: Cigarettes    Start date: 07/22/1984   Smokeless tobacco: Never  Vaping Use   Vaping status: Never Used  Substance and Sexual Activity   Alcohol use: Not Currently    Alcohol/week: 1.0 standard drink of alcohol    Types: 1 Cans of beer per week   Drug use: No   Sexual activity: Not Currently  Other Topics Concern   Not on file  Social History Narrative   Not on file   Social Drivers of Health   Financial Resource Strain: Medium Risk (12/16/2016)   Overall Financial Resource Strain (CARDIA)    Difficulty of Paying Living Expenses: Somewhat hard   Food Insecurity: No Food Insecurity (12/16/2016)   Hunger Vital Sign    Worried About Running Out of Food in the Last Year: Never true    Ran Out of Food in the Last Year: Never true  Transportation Needs: No Transportation Needs (12/16/2016)   PRAPARE - Administrator, Civil Service (Medical): No    Lack of Transportation (Non-Medical): No  Physical Activity: Sufficiently Active (12/16/2016)   Exercise Vital Sign    Days of Exercise per Week: 5 days    Minutes of Exercise per Session: 30 min  Stress: No Stress Concern Present (12/16/2016)   [employer] of Occupational Health - Occupational Stress Questionnaire    Feeling of Stress : Not at all  Social Connections: Moderately Isolated (12/16/2016)   Social Connection and Isolation Panel [NHANES]    Frequency of Communication with Friends and Family: More than three times a week    Frequency of Social Gatherings with Friends and Family: Twice a week    Attends Religious Services: Never    Database administrator or Organizations: No    Attends Engineer, structural: Never    Marital Status: Never married    Allergies: No Known Allergies  Metabolic Disorder Labs: No results found for: "HGBA1C", "MPG" No results found for: "PROLACTIN" No results found for: "CHOL", "TRIG", "HDL", "CHOLHDL", "VLDL", "LDLCALC" No results found for: "TSH"  Therapeutic Level Labs: No results found for: "LITHIUM" No results found for: "VALPROATE" No results found for: "CBMZ"  Current Medications: Current Outpatient Medications  Medication Sig Dispense Refill   amLODipine (NORVASC) 5 MG tablet Take 5 mg by mouth daily.     ibuprofen (ADVIL) 200 MG tablet Take by mouth once.     lamoTRIgine (LAMICTAL) 100 MG tablet Take 1 tablet (100 mg total) by mouth 2 (two) times daily. 180 tablet 1   OLANZapine (ZYPREXA) 20 MG tablet Take 2 tablets (40 mg total) by mouth at bedtime. 180 tablet 0   OLANZapine (ZYPREXA) 20 MG tablet Take 1  tablet (20 mg total) by mouth 2 (two) times daily. 180 tablet 0   oxyCODONE-acetaminophen (PERCOCET/ROXICET) 5-325 MG tablet Take 1 tablet by mouth every 6 (six) hours as needed.     No current facility-administered medications for this visit.     Musculoskeletal: Strength & Muscle Tone: within normal limits Gait & Station: normal Patient leans: N/A  Psychiatric Specialty Exam: Review of Systems  Blood pressure (!) 191/108, pulse 81, temperature 98 F (36.7 C), temperature source Skin, height 6\' 2"  (1.88 m), weight 226 lb (102.5  kg).Body mass index is 29.02 kg/m.  General Appearance: Well Groomed  Eye Contact:  Good  Speech:  Clear and Coherent  Volume:  Normal  Mood:   scared  Affect:  Appropriate, Congruent, and slightly tense, but reactive  Thought Process:  Coherent  Orientation:  Full (Time, Place, and Person)  Thought Content: Logical   Suicidal Thoughts:  No  Homicidal Thoughts:  No  Memory:  Immediate;   Good  Judgement:  Good  Insight:  Good  Psychomotor Activity:  Normal  Concentration:  Concentration: Good and Attention Span: Good  Recall:  Good  Fund of Knowledge: Good  Language: Good  Akathisia:  No  Handed:  Right  AIMS (if indicated): not done  Assets:  Communication Skills Desire for Improvement  ADL's:  Intact  Cognition: WNL  Sleep:  Good   Screenings: GAD-7    Flowsheet Row Office Visit from 12/27/2022 in [facility] Office Visit from 10/26/2022 in [facility]  Total GAD-7 Score 4 4      PHQ2-9    Flowsheet Row Office Visit from 12/27/2022 in [facility] Office Visit from 10/26/2022 in [facility]  PHQ-2 Total Score 2 3  PHQ-9 Total Score 2 5        Assessment and Plan:  DETRICK LOCKERMAN is a 53 y.o. year old male with a history of bipolar disorder vs depression. The patient  was transferred from Dr. Toni Amend.  1. Mood disorder in conditions classified elsewhere Acute stressors include: conflict with his mother  Other stressors include: back pain, unemployment, his mother with bipolar disorder, gambling    History:Tx from Dr. Toni Amend under bipolar vs depression. Depressive symptoms since age 57 . He has a family history of bipolar d/o, although he denies any manic symptoms. Had episode of delusion about radiation in 2019, and olanzapine was uptitrated from 30 mg. Admitted to [facility] due to depression in the context of cocaine use. originally on Lamotrigine 100 mg twice a day olanzapine 40 mg daily Exam is notable for mild difficulty in concentration in the context of stress of longer being denied by [employer].  Explored the possible options to allow him to continue the current medication regimen.  Will continue olanzapine at the current dose to target depression, given he reports significant benefit from the current dose despite possible adverse reaction of weight gain.  Will continue lamotrigine for depression. Noted that although there is a chart diagnosis of bipolar disorder, he denies any mania or hypomania in the past.  Will continue to assess..  Noted that although he will benefit from CBT especially given the current stressor, he would like to work on by himself for now    2. High risk medication use He is advised again to obtain EKG to monitor QTc prolongation.   3. Essential hypertension He was seen by Dr. Judithann Sheen in Jan 2025. Although he had hypertension, so that medication adjustment was done.  Today's hypertension is likely more attributable to stress, although, follow-up is recommended to address this.     Continue Lamotrigine 100 mg twice a day  Continue olanzapine 40 mg daily - monitor weight gain Next appointment-4/3 at 3 PM, IP Obtain EKG - please call [phone] to make an appointment      The patient demonstrates the following risk factors  for suicide: Chronic risk factors for suicide include: psychiatric disorder of depression and substance use  disorder. Acute risk factors for suicide include: unemployment. Protective factors for this patient include: positive social support, responsibility to others (children, family), coping skills, and hope for the future. Considering these factors, the overall suicide risk at this point appears to be low. Patient is appropriate for outpatient follow up.    A total of 30 minutes was spent on the following activities during the encounter date, which includes but is not limited to: preparing to see the patient (e.g., reviewing tests and records), obtaining and/or reviewing separately obtained history, performing a medically necessary examination or evaluation, counseling and educating the patient, family, or caregiver, ordering medications, tests, or procedures, referring and communicating with other healthcare professionals (when not reported separately), documenting clinical information in the electronic or paper health record, independently interpreting test or lab results and communicating these results to the family or caregiver, and coordinating care (when not reported separately).   Collaboration of Care: Collaboration of Care: Other reviewed notes in Epic  Patient/Guardian was advised Release of Information must be obtained prior to any record release in order to collaborate their care with an outside provider. Patient/Guardian was advised if they have not already done so to contact the registration department to sign all necessary forms in order for [place] to release information regarding their care.   Consent: Patient/Guardian gives verbal consent for treatment and assignment of benefits for services provided during this visit. Patient/Guardian expressed understanding and agreed to proceed.    Neysa Hotter, MD 02/03/2023, 3:07 PM

## 2023-02-03 NOTE — Patient Instructions (Addendum)
Continue Lamotrigine 100 mg twice a day  Continue olanzapine 40 mg daily  Next appointment-4/3 at 3 PM Obtain EKG - please call [phone] to make an appointment

## 2023-02-17 ENCOUNTER — Ambulatory Visit: Payer: Medicare Other | Admitting: Psychiatry

## 2023-03-21 NOTE — Progress Notes (Signed)
 Rodney Morrison is a  53 y.o. male who presents for  CHIEF COMPLAINT Chief Complaint  Patient presents with  . Follow-up  . Hypertension  . Hyperlipidemia  . Nicotine Dependence    Subjective: History of Present Illness  Pt in NAD. BP elevated off meds. Has HLD not on statin and depression on meds. Still smoking. Weight stable. Having severe back pain. Has known DDD. No fever. Denies Cp or SOB. No palpitations. No change in bowels or bladder.    Past Medical History:  Diagnosis Date  . H/O bipolar disorder   . Hyperlipidemia    Patient Active Problem List  Diagnosis  . HTN, goal below 140/80  . Recurrent major depressive disorder, in partial remission (CMS-HCC)  . Tobacco use  . Pure hypercholesterolemia  . General psychiatric examination  . Major depressive disorder, recurrent severe without psychotic features (CMS/HHS-HCC)    Past Surgical History:  Procedure Laterality Date  . BACK SURGERY    . LEFT ANKLE SURGERY Left      Current Outpatient Medications:  .  ibuprofen (MOTRIN) 200 MG tablet, Take 400 mg by mouth 2 (two) times daily as needed for Pain, Disp: , Rfl:  .  lamoTRIgine  (LAMICTAL ) 100 MG tablet, Take 200 mg by mouth every morning, Disp: , Rfl: 1 .  OLANZapine  (ZYPREXA ) 20 MG tablet, Take 40 mg by mouth nightly, Disp: , Rfl:  .  oxyCODONE-acetaminophen (PERCOCET) 5-325 mg tablet, Take 1 tablet by mouth every 6 (six) hours as needed, Disp: 100 tablet, Rfl: 0  Prednisone  Social History   Socioeconomic History  . Marital status: Single  . Number of children: 0  . Years of education: 14  Occupational History  . Occupation: Disabled  Tobacco Use  . Smoking status: Every Day    Current packs/day: 0.50    Average packs/day: 0.5 packs/day for 10.0 years (5.0 ttl pk-yrs)    Types: Cigarettes  . Smokeless tobacco: Former    Types: Snuff  Substance and Sexual Activity  . Alcohol use: Not Currently  . Drug use: Never  . Sexual activity: Not Currently     Partners: Female   Social Drivers of Health   Financial Resource Strain: Medium Risk (12/16/2016)   Received from [facility], [place]   Overall Financial Resource Strain (CARDIA)   . Difficulty of Paying Living Expenses: Somewhat hard  Food Insecurity: No Food Insecurity (12/16/2016)   Received from [facility], [place]   Hunger Vital Sign   . Worried About Programme researcher, broadcasting/film/video in the Last Year: Never true   . Ran Out of Food in the Last Year: Never true  Transportation Needs: No Transportation Needs (12/16/2016)   Received from [facility], [place]   [facility] - Transportation   . Lack of Transportation (Medical): No   . Lack of Transportation (Non-Medical): No  Physical Activity: Sufficiently Active (12/16/2016)   Received from [facility], [place]   Exercise Vital Sign   . Days of Exercise per Week: 5 days   . Minutes of Exercise per Session: 30 min  Stress: No Stress Concern Present (12/16/2016)   Received from [facility], [facility]   [facility] - Occupational Stress Questionnaire   . Feeling of Stress : Not at all  Social Connections: Moderately Isolated (12/16/2016)   Received from [facility], [facility]   Social Connection and Isolation Panel [NHANES]   . Frequency of Communication with Friends and Family: More than  three times a week   . Frequency of Social Gatherings with Friends and Family: Twice a week   . Attends Religious Services: Never   . Active Member of Clubs or Organizations: No   . Attends Banker Meetings: Never   . Marital Status: Never married  Housing Stability: Unknown (01/20/2023)   Housing Stability Vital Sign   . Homeless in the Last Year: No    Family History  Problem Relation Name Age of Onset  . Pancreatic cancer Maternal Grandmother    . Brain cancer Maternal Grandfather    . No Known Problems Mother    . No Known Problems Father    . No Known Problems Brother      A comprehensive  ROS was negative except for back pain  PE: BP (!) 176/90   Pulse 74   Ht 188 cm (6' 2)   Wt (!) 103.9 kg (229 lb)   SpO2 97%   BMI 29.40 kg/m  General. Alert oriented x3   Eyes. Sclera and conjunctiva clear; pupils equal round and reactive to light and accommodation; extraocular movements intact Nose. Mucosa healthy without drainage or ulceration Oropharynx. No suspicious lesions Neck. No swelling, masses, stiffness, pain, limited movement, carotid pulses normal bilaterally, thyroid normal size, no masses palpated.  No bruits Lungs. Respirations unlabored; clear to auscultation bilaterally Back. No spinal deformity Cardiovascular. Heart regular rate and rhythm without murmurs, gallops, or rubs Abdomen. Soft; non tender; non distended; normoactive bowel sounds; no masses or organomegaly Lymph Nodes. No significant cervical, supraclavicular, axillary or inguinal lymphadenopathy noted Musculoskeletal. No deformities; no active joint inflammation Extremities. Normal, no edema Pulses. Dorsalis pedis palpable and symmetric bilaterally Neurologic. Alert and oriented; speech intact; face symmetrical; moves all extremities well  Ancillary Orders on 01/20/2023  Component Date Value Ref Range Status  . Cholesterol, Total 01/20/2023 208 (H)  100 - 200 mg/dL Final  . Triglyceride 98/90/7974 179  35 - 199 mg/dL Final  . HDL (High Density Lipoprotein) Cho* 01/20/2023 41.8  29.0 - 71.0 mg/dL Final  . LDL Calculated 01/20/2023 869  0 - 130 mg/dL Final  . VLDL Cholesterol 01/20/2023 36  mg/dL Final  . Cholesterol/HDL Ratio 01/20/2023 5.0   Final  . WBC (White Blood Cell Count) 01/20/2023 7.8  4.1 - 10.2 10^3/uL Final  . RBC (Red Blood Cell Count) 01/20/2023 4.57 (L)  4.69 - 6.13 10^6/uL Final  . Hemoglobin 01/20/2023 15.2  14.1 - 18.1 gm/dL Final  . Hematocrit 98/90/7974 43.1  40.0 - 52.0 % Final  . MCV (Mean Corpuscular Volume) 01/20/2023 94.3  80.0 - 100.0 fl Final  . MCH (Mean Corpuscular  Hemoglobin) 01/20/2023 33.3 (H)  27.0 - 31.2 pg Final  . MCHC (Mean Corpuscular Hemoglobin * 01/20/2023 35.3  32.0 - 36.0 gm/dL Final  . Platelet Count 01/20/2023 301  150 - 450 10^3/uL Final  . RDW-CV (Red Cell Distribution Widt* 01/20/2023 12.8  11.6 - 14.8 % Final  . MPV (Mean Platelet Volume) 01/20/2023 10.8  9.4 - 12.4 fl Final  . Neutrophils 01/20/2023 4.32  1.50 - 7.80 10^3/uL Final  . Lymphocytes 01/20/2023 2.34  1.00 - 3.60 10^3/uL Final  . Monocytes 01/20/2023 0.67  0.00 - 1.50 10^3/uL Final  . Eosinophils 01/20/2023 0.40  0.00 - 0.55 10^3/uL Final  . Basophils 01/20/2023 0.10 (H)  0.00 - 0.09 10^3/uL Final  . Neutrophil % 01/20/2023 55.2  32.0 - 70.0 % Final  . Lymphocyte % 01/20/2023 29.8  10.0 - 50.0 %  Final  . Monocyte % 01/20/2023 8.5  4.0 - 13.0 % Final  . Eosinophil % 01/20/2023 5.1 (H)  1.0 - 5.0 % Final  . Basophil% 01/20/2023 1.3  0.0 - 2.0 % Final  . Immature Granulocyte % 01/20/2023 0.1  <=0.7 % Final  . Immature Granulocyte Count 01/20/2023 0.01  <=0.06 10^3/L Final  . Glucose 01/20/2023 98  70 - 110 mg/dL Final  . Sodium 98/90/7974 141  136 - 145 mmol/L Final  . Potassium 01/20/2023 4.1  3.6 - 5.1 mmol/L Final  . Chloride 01/20/2023 103  97 - 109 mmol/L Final  . Carbon Dioxide (CO2) 01/20/2023 27.4  22.0 - 32.0 mmol/L Final  . Urea Nitrogen (BUN) 01/20/2023 7  7 - 25 mg/dL Final  . Creatinine 98/90/7974 1.1  0.7 - 1.3 mg/dL Final  . Glomerular Filtration Rate (eGFR) 01/20/2023 80  >60 mL/min/1.73sq m Final  . Calcium 01/20/2023 9.7  8.7 - 10.3 mg/dL Final  . AST  98/90/7974 14  8 - 39 U/L Final  . ALT  01/20/2023 13  6 - 57 U/L Final  . Alk Phos (alkaline Phosphatase) 01/20/2023 91  34 - 104 U/L Final  . Albumin 01/20/2023 4.6  3.5 - 4.8 g/dL Final  . Bilirubin, Total 01/20/2023 0.4  0.3 - 1.2 mg/dL Final  . Protein, Total 01/20/2023 7.0  6.1 - 7.9 g/dL Final  . A/G Ratio 98/90/7974 1.9  1.0 - 5.0 gm/dL Final  . Color 98/90/7974 Colorless  Colorless, Straw,  Light Yellow, Yellow, Dark Yellow Final  . Clarity 01/20/2023 Clear  Clear Final  . Specific Gravity 01/20/2023 1.003 (L)  1.005 - 1.030 Final  . pH, Urine 01/20/2023 7.0  5.0 - 8.0 Final  . Protein, Urinalysis 01/20/2023 Negative  Negative mg/dL Final  . Glucose, Urinalysis 01/20/2023 Negative  Negative mg/dL Final  . Ketones, Urinalysis 01/20/2023 Negative  Negative mg/dL Final  . Blood, Urinalysis 01/20/2023 Negative  Negative Final  . Nitrite, Urinalysis 01/20/2023 Negative  Negative Final  . Leukocyte Esterase, Urinalysis 01/20/2023 Negative  Negative Final  . Bilirubin, Urinalysis 01/20/2023 Negative  Negative Final  . Urobilinogen, Urinalysis 01/20/2023 0.2  0.2 - 1.0 mg/dL Final  . WBC, UA 98/90/7974 0  <=5 /hpf Final  . Red Blood Cells, Urinalysis 01/20/2023 <1  <=3 /hpf Final  . Bacteria, Urinalysis 01/20/2023 0-5  0 - 5 /hpf Final  . Squamous Epithelial Cells, Urinaly* 01/20/2023 0  /hpf Final  Appointment on 08/31/2022  Component Date Value Ref Range Status  . Cholesterol, Total 08/31/2022 188  100 - 200 mg/dL Final  . Triglyceride 91/79/7975 185  35 - 199 mg/dL Final  . HDL (High Density Lipoprotein) Cho* 08/31/2022 36.9  29.0 - 71.0 mg/dL Final  . LDL Calculated 08/31/2022 885  0 - 130 mg/dL Final  . VLDL Cholesterol 08/31/2022 37  mg/dL Final  . Cholesterol/HDL Ratio 08/31/2022 5.1   Final  Office Visit on 05/27/2022  Component Date Value Ref Range Status  . Cholesterol, Total 05/27/2022 204 (H)  100 - 200 mg/dL Final  . Triglyceride 94/83/7975 125  35 - 199 mg/dL Final  . HDL (High Density Lipoprotein) Cho* 05/27/2022 40.8  29.0 - 71.0 mg/dL Final  . LDL Calculated 05/27/2022 861 (H)  0 - 130 mg/dL Final  . VLDL Cholesterol 05/27/2022 25  mg/dL Final  . Cholesterol/HDL Ratio 05/27/2022 5.0   Final  . WBC (White Blood Cell Count) 05/27/2022 5.7  4.1 - 10.2 10^3/uL Final  . RBC (Red Blood  Cell Count) 05/27/2022 4.96  4.69 - 6.13 10^6/uL Final  . Hemoglobin 05/27/2022  16.0  14.1 - 18.1 gm/dL Final  . Hematocrit 94/83/7975 46.2  40.0 - 52.0 % Final  . MCV (Mean Corpuscular Volume) 05/27/2022 93.1  80.0 - 100.0 fl Final  . MCH (Mean Corpuscular Hemoglobin) 05/27/2022 32.3 (H)  27.0 - 31.2 pg Final  . MCHC (Mean Corpuscular Hemoglobin * 05/27/2022 34.6  32.0 - 36.0 gm/dL Final  . Platelet Count 05/27/2022 301  150 - 450 10^3/uL Final  . RDW-CV (Red Cell Distribution Widt* 05/27/2022 12.5  11.6 - 14.8 % Final  . MPV (Mean Platelet Volume) 05/27/2022 10.6  9.4 - 12.4 fl Final  . Neutrophils 05/27/2022 3.40  1.50 - 7.80 10^3/uL Final  . Lymphocytes 05/27/2022 1.43  1.00 - 3.60 10^3/uL Final  . Monocytes 05/27/2022 0.58  0.00 - 1.50 10^3/uL Final  . Eosinophils 05/27/2022 0.18  0.00 - 0.55 10^3/uL Final  . Basophils 05/27/2022 0.07  0.00 - 0.09 10^3/uL Final  . Neutrophil % 05/27/2022 60.0  32.0 - 70.0 % Final  . Lymphocyte % 05/27/2022 25.2  10.0 - 50.0 % Final  . Monocyte % 05/27/2022 10.2  4.0 - 13.0 % Final  . Eosinophil % 05/27/2022 3.2  1.0 - 5.0 % Final  . Basophil% 05/27/2022 1.2  0.0 - 2.0 % Final  . Immature Granulocyte % 05/27/2022 0.2  <=0.7 % Final  . Immature Granulocyte Count 05/27/2022 0.01  <=0.06 10^3/L Final  . Glucose 05/27/2022 104  70 - 110 mg/dL Final  . Sodium 94/83/7975 139  136 - 145 mmol/L Final  . Potassium 05/27/2022 4.3  3.6 - 5.1 mmol/L Final  . Chloride 05/27/2022 108  97 - 109 mmol/L Final  . Carbon Dioxide (CO2) 05/27/2022 25.0  22.0 - 32.0 mmol/L Final  . Urea Nitrogen (BUN) 05/27/2022 6 (L)  7 - 25 mg/dL Final  . Creatinine 94/83/7975 1.0  0.7 - 1.3 mg/dL Final  . Glomerular Filtration Rate (eGFR) 05/27/2022 90  >60 mL/min/1.73sq m Final  . Calcium 05/27/2022 10.0  8.7 - 10.3 mg/dL Final  . AST  94/83/7975 13  8 - 39 U/L Final  . ALT  05/27/2022 15  6 - 57 U/L Final  . Alk Phos (alkaline Phosphatase) 05/27/2022 109 (H)  34 - 104 U/L Final  . Albumin 05/27/2022 4.7  3.5 - 4.8 g/dL Final  . Bilirubin, Total 05/27/2022  0.4  0.3 - 1.2 mg/dL Final  . Protein, Total 05/27/2022 7.5  6.1 - 7.9 g/dL Final  . A/G Ratio 94/83/7975 1.7  1.0 - 5.0 gm/dL Final  . PSA (Prostate Specific Antigen), T* 05/27/2022 0.69  0.10 - 4.00 ng/mL Final  . Thyroid Stimulating Hormone (TSH) 05/27/2022 2.646  0.450-5.330 uIU/ml uIU/mL Final  . Color 05/28/2022 Light Yellow  Colorless, Straw, Light Yellow, Yellow, Dark Yellow Final  . Clarity 05/28/2022 Clear  Clear Final  . Specific Gravity 05/28/2022 1.007  1.005 - 1.030 Final  . pH, Urine 05/28/2022 5.5  5.0 - 8.0 Final  . Protein, Urinalysis 05/28/2022 Negative  Negative mg/dL Final  . Glucose, Urinalysis 05/28/2022 Negative  Negative mg/dL Final  . Ketones, Urinalysis 05/28/2022 Negative  Negative mg/dL Final  . Blood, Urinalysis 05/28/2022 Negative  Negative Final  . Nitrite, Urinalysis 05/28/2022 Negative  Negative Final  . Leukocyte Esterase, Urinalysis 05/28/2022 Negative  Negative Final  . Bilirubin, Urinalysis 05/28/2022 Negative  Negative Final  . Urobilinogen, Urinalysis 05/28/2022 0.2  0.2 - 1.0 mg/dL Final  .  WBC, UA 05/28/2022 <1  <=5 /hpf Final  . Red Blood Cells, Urinalysis 05/28/2022 <1  <=3 /hpf Final  . Bacteria, Urinalysis 05/28/2022 0-5  0 - 5 /hpf Final  . Squamous Epithelial Cells, Urinaly* 05/28/2022 0  /hpf Final  . Hemoccult ICT 06/28/2022 Negative  Negative Final  . Hemoccult ICT 06/28/2022 Negative  Negative Final   DIAGNOSIS: HTN, goal below 140/80  (primary encounter diagnosis)  Pure hypercholesterolemia  Recurrent major depressive disorder, in partial remission (CMS-HCC)  Tobacco use   PLAN: HTN- trial of Hydralazine HLD- diet/exercise, labs 3 mo Depression per Psych Tobacco use- stop smoking, CXR 3 mo Back pain- meds refilled. RTC 3 mo, sooner if needed     Attestation Statement:   I personally performed the service. (TP)  Reyes JONETTA Costa, MD, MD

## 2023-04-09 NOTE — Progress Notes (Unsigned)
 BH MD/PA/NP OP Progress Note  04/14/2023 3:38 PM NORMAL RECINOS  MRN:  [id number]  Chief Complaint:  Chief Complaint  Patient presents with   Follow-up   HPI:  This is a follow-up appointment for mood disorder.  He states that he has been doing well.  He just has a chemical imbalance.  He states that he has mood symptoms since teenager.  He talks about his family history of bipolar disorder.  When he is not on medication, he cannot eat or move.  However, he feels normal when he is on the medication.  He does not feel high or low.  He states that his mother is back to drinking and gambling again.  He does not think he can do.  He cannot visit his mother in [place] either due to back issues. She is a "total mess."  He tries to think that there is nothing he can do, which helped him to be out from depression.  His father has been supportive.  He feels like he could have him as a father.  He is planning to go fishing with him, which they enjoy.  He sleeps 6 hours.  He denies change in appetite.  He denies feeling depressed.  He denies SI.  He denies hallucinations or paranoia.  He denies decreased need for sleep or euphoria.  He is aware of his hypertension, and is working on smoking cessation.   Blood Pressure 176/90 03/21/2023 4:15 PM EDT    Fluctuates between 210-230 lbs  Wt Readings from Last 3 Encounters:  04/14/23 226 lb 6.4 oz (102.7 kg)  02/03/23 226 lb (102.5 kg)  12/27/22 228 lb (103.4 kg)    10/26/22 222 lb 9.6 oz (101 kg)  02/09/18 213 lb 6.4 oz (96.8 kg)    Substance use   Tobacco Alcohol Other substances/  Current  2 PPD denies Denies (four cups of coffee, two pepsi to feel "high" when he is bored)  Past   denies Cocaine, last in 2008 ("never addicted"  Past Treatment  bupropion, nicotine patch, varenicline          Support: father Household: father (with COPD, Afib, 66 yo) Marital status: single Number of children: 0  Employment: on disability, unemployed, used to  work in [employer], Social research officer, government, carpentry Education: [employer] two years He was raised by his mother and his stepfather.  He reports good relationship with them as a child, and describes his stepfather as a good guy.  Her mother is dominant, and his stepfather becomes more dominant when he earns more money.  His mother suffers from bipolar disorder, and later has issues with a gambling.  He states that his mother is good at manipulating a man.   Visit Diagnosis:    ICD-10-CM   1. Mood disorder in conditions classified elsewhere  F06.30     2. High risk medication use  Z79.899       Past Psychiatric History: Please see initial evaluation for full details. I have reviewed the history. No updates at this time.     Past Medical History:  Past Medical History:  Diagnosis Date   Anxiety    Depression     Past Surgical History:  Procedure Laterality Date   BACK SURGERY      Family Psychiatric History: Please see initial evaluation for full details. I have reviewed the history. No updates at this time.     Family History:  Family History  Problem Relation Age of Onset  Bipolar disorder Mother    Drug abuse Cousin     Social History:  Social History   Socioeconomic History   Marital status: Single    Spouse name: Not on file   Number of children: 0   Years of education: Not on file   Highest education level: Some college, no degree  Occupational History    Comment: disabled  Tobacco Use   Smoking status: Every Day    Current packs/day: 1.00    Average packs/day: 1 pack/day for 38.7 years (38.7 ttl pk-yrs)    Types: Cigarettes    Start date: 07/22/1984   Smokeless tobacco: Never  Vaping Use   Vaping status: Never Used  Substance and Sexual Activity   Alcohol use: Not Currently    Alcohol/week: 1.0 standard drink of alcohol    Types: 1 Cans of beer per week   Drug use: No   Sexual activity: Not Currently  Other Topics Concern   Not on file  Social History  Narrative   Not on file   Social Drivers of Health   Financial Resource Strain: Medium Risk (12/16/2016)   Overall Financial Resource Strain (CARDIA)    Difficulty of Paying Living Expenses: Somewhat hard  Food Insecurity: No Food Insecurity (12/16/2016)   Hunger Vital Sign    Worried About Running Out of Food in the Last Year: Never true    Ran Out of Food in the Last Year: Never true  Transportation Needs: No Transportation Needs (12/16/2016)   PRAPARE - Administrator, Civil Service (Medical): No    Lack of Transportation (Non-Medical): No  Physical Activity: Sufficiently Active (12/16/2016)   Exercise Vital Sign    Days of Exercise per Week: 5 days    Minutes of Exercise per Session: 30 min  Stress: No Stress Concern Present (12/16/2016)   [employer] of Occupational Health - Occupational Stress Questionnaire    Feeling of Stress : Not at all  Social Connections: Moderately Isolated (12/16/2016)   Social Connection and Isolation Panel [NHANES]    Frequency of Communication with Friends and Family: More than three times a week    Frequency of Social Gatherings with Friends and Family: Twice a week    Attends Religious Services: Never    Database administrator or Organizations: No    Attends Engineer, structural: Never    Marital Status: Never married    Allergies: No Known Allergies  Metabolic Disorder Labs: No results found for: "HGBA1C", "MPG" No results found for: "PROLACTIN" No results found for: "CHOL", "TRIG", "HDL", "CHOLHDL", "VLDL", "LDLCALC" No results found for: "TSH"  Therapeutic Level Labs: No results found for: "LITHIUM" No results found for: "VALPROATE" No results found for: "CBMZ"  Current Medications: Current Outpatient Medications  Medication Sig Dispense Refill   amLODipine (NORVASC) 5 MG tablet Take 5 mg by mouth daily.     ibuprofen (ADVIL) 200 MG tablet Take by mouth once.     oxyCODONE-acetaminophen (PERCOCET/ROXICET)  5-325 MG tablet Take 1 tablet by mouth every 6 (six) hours as needed.     lamoTRIgine (LAMICTAL) 100 MG tablet Take 1 tablet (100 mg total) by mouth 2 (two) times daily. 180 tablet 1   OLANZapine (ZYPREXA) 20 MG tablet Take 1 tablet (20 mg total) by mouth in the morning and at bedtime. 180 tablet 0   No current facility-administered medications for this visit.     Musculoskeletal: Strength & Muscle Tone:  Normal Gait & Station: normal  Patient leans: N/A  Psychiatric Specialty Exam: Review of Systems  Psychiatric/Behavioral:  Negative for agitation, behavioral problems, confusion, decreased concentration, dysphoric mood, hallucinations, self-injury, sleep disturbance and suicidal ideas. The patient is not nervous/anxious and is not hyperactive.   All other systems reviewed and are negative.   Blood pressure (!) 160/100, pulse 89, temperature 98.5 F (36.9 C), temperature source Temporal, height 6\' 2"  (1.88 m), weight 226 lb 6.4 oz (102.7 kg), SpO2 100%.Body mass index is 29.07 kg/m.  General Appearance: Well Groomed  Eye Contact:  Good  Speech:  Clear and Coherent  Volume:  Normal  Mood:   good  Affect:  Appropriate, Congruent, and Full Range  Thought Process:  Coherent  Orientation:  Full (Time, Place, and Person)  Thought Content: Logical   Suicidal Thoughts:  No  Homicidal Thoughts:  No  Memory:  Immediate;   Good  Judgement:  Good  Insight:  Good  Psychomotor Activity:  Normal, Normal tone, no rigidity, no resting/postural tremors, no tardive dyskinesia    Concentration:  Concentration: Good and Attention Span: Good  Recall:  Good  Fund of Knowledge: Good  Language: Good  Akathisia:  No  Handed:  Right  AIMS (if indicated): 0   Assets:  Communication Skills Desire for Improvement  ADL's:  Intact  Cognition: WNL  Sleep:  Good   Screenings: GAD-7    Flowsheet Row Office Visit from 12/27/2022 in [facility] Office Visit from  10/26/2022 in [facility]  Total GAD-7 Score 4 4      PHQ2-9    Flowsheet Row Office Visit from 12/27/2022 in [facility] Office Visit from 10[facility]26/2022 in [facility]  PHQ-2 Total Score 2 3  PHQ-9 Total Score 2 5        Assessment and Plan:  Rodney Morrison is a 53 y.o. year old male with a history of bipolar disorder vs depression, who is referred for the follow up for below.  1. Mood disorder in conditions classified elsewhere Acute stressors include: conflict with his mother  Other stressors include: back pain, unemployment, his mother with bipolar disorder, gambling    History:Tx from Dr. Toni Amend under bipolar vs depression. Depressive symptoms since age 78 . He has a family history of bipolar d/o, although he denies any manic symptoms. Had episode of delusion about radiation in 2019, and olanzapine was uptitrated from 30 mg. Admitted to [facility] due to depression in the context of cocaine use. originally on Lamotrigine 100 mg twice a day olanzapine 40 mg daily Exam is notable for brighter affect, and he denies any significant mood symptoms since her last visit.  Will continue current dose of olanzapine and lamotrigine for depression given he reports significant benefit from the current medication regimen. Noted that although there is a chart diagnosis of bipolar disorder, he denies any mania or hypomania in the past.  Will continue to assess  2. High risk medication use He was advised again to obtain EKG to monitor QTc prolongation.   # hypertension According to the chart review, Dr. Judithann Sheen is trying to prescribe hydralazine, although he is not aware of this.  He was advised to discuss this with his primary care provider.     Last checked  EKG HR , QTcmsec   Lipid panels LDL 130, Chol 208 01/2023  WGN5A Glu 98 01/2023       Continue Lam[facility]ine 100 mg twice  a  day - [facility] Continue [facility]anzapine 40 mg daily - [facility] Next appointment- 5/29 at 3 pm for 30 mins, IP Obtain EKG - please call [phone] to make an appointment      The patient demonstrates the following risk factors for suicide: Chronic risk factors for suicide include: psychiatric disorder of depression and substance use disorder. Acute risk factors for suicide include: unemployment. Protective factors for this patient include: positive social support, responsibility to others (children, family), coping skills, and hope for the future. Considering these factors, the overall suicide risk at this point appears to be low. Patient is appropriate for outpatient follow up.   A total of 30 minutes was spent on the following activities during the encounter date, which includes but is not limited to: preparing to see the patient (e.g., reviewing tests and records), obtaining and/or reviewing separately obtained history, performing a medically necessary examination or evaluation, counseling and educating the patient, family, or caregiver, ordering medications, tests, or procedures, referring and communicating with other healthcare professionals (when not reported separately), documenting clinical information in the electronic or paper health record, independently interpreting test or lab results and communicating these results to the family or caregiver, and coordinating care (when not reported separately).   Collaboration of Care: Collaboration of Care: Other reviewed notes in Epic  Patient/Guardian was advised Release of Information must be obtained prior to any record release in order to collaborate their care with an outside provider. Patient/Guardian was advised if they have not already done so to contact the registration department to sign all necessary forms in order for [place] to release information regarding their care.   Consent: Patient/Guardian gives verbal consent for  treatment and assignment of benefits for services provided during this visit. Patient/Guardian expressed understanding and agreed to proceed.    Neysa Hotter, MD 04/14/2023, 3:38 PM

## 2023-04-14 ENCOUNTER — Encounter: Payer: Self-pay | Admitting: Psychiatry

## 2023-04-14 ENCOUNTER — Ambulatory Visit: Payer: Self-pay | Admitting: Psychiatry

## 2023-04-14 VITALS — BP 160/100 | HR 89 | Temp 98.5°F | Ht 74.0 in | Wt 226.4 lb

## 2023-04-14 DIAGNOSIS — F0630 Mood disorder due to known physiological condition, unspecified: Secondary | ICD-10-CM

## 2023-04-14 DIAGNOSIS — Z79899 Other long term (current) drug therapy: Secondary | ICD-10-CM

## 2023-04-14 MED ORDER — OLANZAPINE 20 MG PO TABS: 20.0000 mg | ORAL_TABLET | Freq: Two times a day (BID) | ORAL | 0 refills | Status: DC

## 2023-04-14 MED ORDER — LAMOTRIGINE 100 MG PO TABS: 100.0000 mg | ORAL_TABLET | Freq: Two times a day (BID) | ORAL | 1 refills | Status: DC

## 2023-04-14 NOTE — Patient Instructions (Signed)
 Continue Lamotrigine 100 mg twice a day Continue olanzapine 40 mg daily Next appointment- 5/29 at 3 pm for 30 mins Obtain EKG

## 2023-04-17 ENCOUNTER — Other Ambulatory Visit: Payer: Self-pay | Admitting: Psychiatry

## 2023-04-18 ENCOUNTER — Telehealth: Payer: Self-pay

## 2023-04-18 NOTE — Telephone Encounter (Signed)
 It was filled at 20 mg twice a day, and the refill was recently ordered. Could you do PA if applicable? Thanks.

## 2023-04-18 NOTE — Telephone Encounter (Signed)
 Received fax from patients pharmacy requesting a refill for the following medication with a note to provider stating that Insurance max of one table per day please advise   OLANZapine (ZYPREXA) 20 MG tablet      Last visit 04-14-23 Next visit 06-09-23    Pharmacy [facility] [address] [place], [place] - [address] Phone: [phone]  Fax: [phone]

## 2023-04-21 ENCOUNTER — Telehealth: Payer: Self-pay

## 2023-04-21 NOTE — Telephone Encounter (Signed)
 Could you contact the pharmacy to ask whether the patient can use a coupon, such as GoodRx, for an olanzapine 20 mg twice daily prescription, and whether it can be dispensed there?  Please also inquire if they have any suggestions or alternatives that would help the patient continue taking this medication consistently.

## 2023-04-21 NOTE — Telephone Encounter (Signed)
 Prior Authorization submitted via CoverMyMeds was denied for the patients Olanzapine 20 mg tablets due to not meeting the quantity limit patients plan will only pay for one tablet per day Coverage for the requested quantity requires one of the following:   (1) The higher dose or quantity is supported in the dosage and administration section of the manufacturer's prescribing information. (2) The higher dose or quantity is supported by one of the following Medicare approved compendia: (A) [facility] Formulary Service Drug Information. (B) [employer] DRUGDEX Information System.

## 2023-04-25 NOTE — Telephone Encounter (Signed)
 Could you please try reaching out to him again tomorrow? If we're still unable to reach him, we'll wait to hear back from him.

## 2023-04-25 NOTE — Telephone Encounter (Signed)
 Spoke to [place] at patients pharmacy she stated that she ran the Olanzapine 20 tablets under the GoodRx and he can get 120 tablets/2 month supply for $39.60 called patient no answer unable to leave a voicemail phone continued to ring

## 2023-04-27 NOTE — Telephone Encounter (Signed)
 I have made several attempts to call patient no answer left voicemail for patient to return call to office

## 2023-04-28 ENCOUNTER — Telehealth: Payer: Self-pay | Admitting: Psychiatry

## 2023-05-16 ENCOUNTER — Telehealth: Payer: Self-pay | Admitting: Psychiatry

## 2023-05-16 NOTE — Telephone Encounter (Signed)
 I received a notification from the pharmacy that olanzapine  20 mg, two tablets per day, has been approved. While the patient may already be aware, could you please notify him of this update just to ensure he's informed? Thanks.

## 2023-06-04 NOTE — Progress Notes (Deleted)
 BH MD/PA/NP OP Progress Note  06/04/2023 4:41 PM Rodney Morrison  MRN:  [id number]  Chief Complaint: No chief complaint on file.  HPI: ***  Substance use   Tobacco Alcohol Other substances/  Current  2 PPD denies Denies (four cups of coffee, two pepsi to feel "high" when he is bored)  Past   denies Cocaine, last in 2008 ("never addicted"  Past Treatment  bupropion , nicotine patch, varenicline           Support: father Household: father (with COPD, Afib, 54 yo) Marital status: single Number of children: 0  Employment: on disability, unemployed, used to work in [employer], Social research officer, government, carpentry Education: [employer] two years He was raised by his mother and his stepfather.  He reports good relationship with them as a child, and describes his stepfather as a good guy.  Her mother is dominant, and his stepfather becomes more dominant when he earns more money.  His mother suffers from bipolar disorder, and later has issues with a gambling.  He states that his mother is good at manipulating a man.   Visit Diagnosis: No diagnosis found.  Past Psychiatric History: Please see initial evaluation for full details. I have reviewed the history. No updates at this time.     Past Medical History:  Past Medical History:  Diagnosis Date   Anxiety    Depression     Past Surgical History:  Procedure Laterality Date   BACK SURGERY      Family Psychiatric History: Please see initial evaluation for full details. I have reviewed the history. No updates at this time.     Family History:  Family History  Problem Relation Age of Onset   Bipolar disorder Mother    Drug abuse Cousin     Social History:  Social History   Socioeconomic History   Marital status: Single    Spouse name: Not on file   Number of children: 0   Years of education: Not on file   Highest education level: Some college, no degree  Occupational History    Comment: disabled  Tobacco Use   Smoking status: Every  Day    Current packs/day: 1.00    Average packs/day: 1 pack/day for 38.9 years (38.9 ttl pk-yrs)    Types: Cigarettes    Start date: 07/22/1984   Smokeless tobacco: Never  Vaping Use   Vaping status: Never Used  Substance and Sexual Activity   Alcohol use: Not Currently    Alcohol/week: 1.0 standard drink of alcohol    Types: 1 Cans of beer per week   Drug use: No   Sexual activity: Not Currently  Other Topics Concern   Not on file  Social History Narrative   Not on file   Social Drivers of Health   Financial Resource Strain: Medium Risk (12/16/2016)   Overall Financial Resource Strain (CARDIA)    Difficulty of Paying Living Expenses: Somewhat hard  Food Insecurity: No Food Insecurity (12/16/2016)   Hunger Vital Sign    Worried About Running Out of Food in the Last Year: Never true    Ran Out of Food in the Last Year: Never true  Transportation Needs: No Transportation Needs (12/16/2016)   PRAPARE - Administrator, Civil Service (Medical): No    Lack of Transportation (Non-Medical): No  Physical Activity: Sufficiently Active (12/16/2016)   Exercise Vital Sign    Days of Exercise per Week: 5 days    Minutes of Exercise per Session:  30 min  Stress: No Stress Concern Present (12/16/2016)   [employer] of Occupational Health - Occupational Stress Questionnaire    Feeling of Stress : Not at all  Social Connections: Moderately Isolated (12/16/2016)   Social Connection and Isolation Panel [NHANES]    Frequency of Communication with Friends and Family: More than three times a week    Frequency of Social Gatherings with Friends and Family: Twice a week    Attends Religious Services: Never    Database administrator or Organizations: No    Attends Engineer, structural: Never    Marital Status: Never married    Allergies: No Known Allergies  Metabolic Disorder Labs: No results found for: "HGBA1C", "MPG" No results found for: "PROLACTIN" No results found  for: "CHOL", "TRIG", "HDL", "CHOLHDL", "VLDL", "LDLCALC" No results found for: "TSH"  Therapeutic Level Labs: No results found for: "LITHIUM " No results found for: "VALPROATE" No results found for: "CBMZ"  Current Medications: Current Outpatient Medications  Medication Sig Dispense Refill   amLODipine (NORVASC) 5 MG tablet Take 5 mg by mouth daily.     ibuprofen (ADVIL) 200 MG tablet Take by mouth once.     lamoTRIgine  (LAMICTAL ) 100 MG tablet Take 1 tablet (100 mg total) by mouth 2 (two) times daily. 180 tablet 1   OLANZapine  (ZYPREXA ) 20 MG tablet Take 1 tablet (20 mg total) by mouth in the morning and at bedtime. 180 tablet 0   oxyCODONE-acetaminophen (PERCOCET/ROXICET) 5-325 MG tablet Take 1 tablet by mouth every 6 (six) hours as needed.     No current facility-administered medications for this visit.     Musculoskeletal: Strength & Muscle Tone: within normal limits Gait & Station: normal Patient leans: N/A  Psychiatric Specialty Exam: Review of Systems  There were no vitals taken for this visit.There is no height or weight on file to calculate BMI.  General Appearance: {Appearance:22683}  Eye Contact:  {BHH EYE CONTACT:22684}  Speech:  Clear and Coherent  Volume:  Normal  Mood:  {BHH MOOD:22306}  Affect:  {Affect (PAA):22687}  Thought Process:  Coherent  Orientation:  Full (Time, Place, and Person)  Thought Content: Logical   Suicidal Thoughts:  {ST/HT (PAA):22692}  Homicidal Thoughts:  {ST/HT (PAA):22692}  Memory:  Immediate;   Good  Judgement:  {Judgement (PAA):22694}  Insight:  {Insight (PAA):22695}  Psychomotor Activity:  Normal  Concentration:  Concentration: Good and Attention Span: Good  Recall:  Good  Fund of Knowledge: Good  Language: Good  Akathisia:  No  Handed:  Right  AIMS (if indicated): not done  Assets:  Communication Skills Desire for Improvement  ADL's:  Intact  Cognition: WNL  Sleep:  {BHH GOOD/FAIR/POOR:22877}   Screenings: GAD-7     Flowsheet Row Office Visit from 12/27/2022 in [facility] Office Visit from 10/26/2022 in [facility]  Total GAD-7 Score 4 4      PHQ2-9    Flowsheet Row Office Visit from 12/27/2022 in [facility] Office Visit from 10/26/2022 in [facility]  PHQ-2 Total Score 2 3  PHQ-9 Total Score 2 5        Assessment and Plan:  Rodney Morrison is a 53 y.o. year old male with a history of bipolar disorder vs depression, who is referred for the follow up for below.   1. Mood disorder in conditions classified elsewhere Acute stressors include: conflict with his mother  Other  stressors include: back pain, unemployment, his mother with bipolar disorder, gambling    History:Tx from Dr. Andrena Bang under bipolar vs depression. Depressive symptoms since age 89 . He has a family history of bipolar d/o, although he denies any manic symptoms. Had episode of delusion about radiation in 2019, and olanzapine  was uptitrated from 30 mg. Admitted to [facility] due to depression in the context of cocaine use. originally on Lamotrigine  100 mg twice a day olanzapine  40 mg daily Exam is notable for brighter affect, and he denies any significant mood symptoms since her last visit.  Will continue current dose of olanzapine  and lamotrigine  for depression given he reports significant benefit from the current medication regimen. Noted that although there is a chart diagnosis of bipolar disorder, he denies any mania or hypomania in the past.  Will continue to assess   2. High risk medication use He was advised again to obtain EKG to monitor QTc prolongation.    # hypertension According to the chart review, Dr. Claudius Cumins is trying to prescribe hydralazine, although he is not aware of this.  He was advised to discuss this with his primary care provider.        Last checked   EKG HR , QTcmsec    Lipid panels LDL 130, Chol 208 01/2023  HQI6N Glu 98 01/2023       Continue Lamotrigine  100 mg twice a day - [facility] Continue olanzapine  40 mg daily - [facility] - [facility] Next appointment- 5/29 at 3 pm for 30 mins, IP Obtain EKG - please call [phone] to make an appointment      The patient demonstrates the following risk factors for suicide: Chronic risk factors for suicide include: psychiatric disorder of depression and substance use disorder. Acute risk factors for suicide include: unemployment. Protective factors for this patient include: positive social support, responsibility to others (children, family), coping skills, and hope for the future. Considering these factors, the overall suicide risk at this point appears to be low. Patient is appropriate for outpatient follow up.   Collaboration of Care: Collaboration of Care: {BH OP Collaboration of Care:21014065}  Patient/Guardian was advised Release of Information must be obtained prior to any record release in order to collaborate their care with an outside provider. Patient/Guardian was advised if they have not already done so to contact the registration department to sign all necessary forms in order for us  to release information regarding their care.   Consent: Patient/Guardian gives verbal consent for treatment and assignment of benefits for services provided during this visit. Patient/Guardian expressed understanding and agreed to proceed.    Todd Fossa, MD 06/04/2023, 4:41 PM

## 2023-06-09 ENCOUNTER — Ambulatory Visit: Admitting: Psychiatry

## 2023-07-17 NOTE — Progress Notes (Unsigned)
 BH MD/PA/NP OP Progress Note  07/17/2023 2:14 PM Rodney Morrison  MRN:  [id number]  Chief Complaint: No chief complaint on file.  HPI: ***  Olanzapine   Substance use   Tobacco Alcohol Other substances/  Current  2 PPD denies Denies (four cups of coffee, two pepsi to feel high when he is bored)  Past   denies Cocaine, last in 2008 (never addicted  Past Treatment  bupropion , nicotine patch, varenicline           Support: father Household: father (with COPD, Afib, 6 yo) Marital status: single Number of children: 0  Employment: on disability, unemployed, used to work in [employer], Social research officer, government, carpentry Education: [employer] two years He was raised by his mother and his stepfather.  He reports good relationship with them as a child, and describes his stepfather as a good guy.  Her mother is dominant, and his stepfather becomes more dominant when he earns more money.  His mother suffers from bipolar disorder, and later has issues with a gambling.  He states that his mother is good at manipulating a man.    Visit Diagnosis: No diagnosis found.  Past Psychiatric History: Please see initial evaluation for full details. I have reviewed the history. No updates at this time.     Past Medical History:  Past Medical History:  Diagnosis Date   Anxiety    Depression     Past Surgical History:  Procedure Laterality Date   BACK SURGERY      Family Psychiatric History: Please see initial evaluation for full details. I have reviewed the history. No updates at this time.     Family History:  Family History  Problem Relation Age of Onset   Bipolar disorder Mother    Drug abuse Cousin     Social History:  Social History   Socioeconomic History   Marital status: Single    Spouse name: Not on file   Number of children: 0   Years of education: Not on file   Highest education level: Some college, no degree  Occupational History    Comment: disabled  Tobacco Use   Smoking  status: Every Day    Current packs/day: 1.00    Average packs/day: 1 pack/day for 39.0 years (39.0 ttl pk-yrs)    Types: Cigarettes    Start date: 07/22/1984   Smokeless tobacco: Never  Vaping Use   Vaping status: Never Used  Substance and Sexual Activity   Alcohol use: Not Currently    Alcohol/week: 1.0 standard drink of alcohol    Types: 1 Cans of beer per week   Drug use: No   Sexual activity: Not Currently  Other Topics Concern   Not on file  Social History Narrative   Not on file   Social Drivers of Health   Financial Resource Strain: Medium Risk (12/16/2016)   Overall Financial Resource Strain (CARDIA)    Difficulty of Paying Living Expenses: Somewhat hard  Food Insecurity: No Food Insecurity (12/16/2016)   Hunger Vital Sign    Worried About Running Out of Food in the Last Year: Never true    Ran Out of Food in the Last Year: Never true  Transportation Needs: No Transportation Needs (12/16/2016)   PRAPARE - Administrator, Civil Service (Medical): No    Lack of Transportation (Non-Medical): No  Physical Activity: Sufficiently Active (12/16/2016)   Exercise Vital Sign    Days of Exercise per Week: 5 days    Minutes of  Exercise per Session: 30 min  Stress: No Stress Concern Present (12/16/2016)   [employer] of Occupational Health - Occupational Stress Questionnaire    Feeling of Stress : Not at all  Social Connections: Moderately Isolated (12/16/2016)   Social Connection and Isolation Panel    Frequency of Communication with Friends and Family: More than three times a week    Frequency of Social Gatherings with Friends and Family: Twice a week    Attends Religious Services: Never    Database administrator or Organizations: No    Attends Engineer, structural: Never    Marital Status: Never married    Allergies: No Known Allergies  Metabolic Disorder Labs: No results found for: HGBA1C, MPG No results found for: PROLACTIN No results  found for: CHOL, TRIG, HDL, CHOLHDL, VLDL, LDLCALC No results found for: TSH  Therapeutic Level Labs: No results found for: LITHIUM  No results found for: VALPROATE No results found for: CBMZ  Current Medications: Current Outpatient Medications  Medication Sig Dispense Refill   amLODipine (NORVASC) 5 MG tablet Take 5 mg by mouth daily.     ibuprofen (ADVIL) 200 MG tablet Take by mouth once.     lamoTRIgine  (LAMICTAL ) 100 MG tablet Take 1 tablet (100 mg total) by mouth 2 (two) times daily. 180 tablet 1   OLANZapine  (ZYPREXA ) 20 MG tablet Take 1 tablet (20 mg total) by mouth in the morning and at bedtime. 180 tablet 0   oxyCODONE-acetaminophen (PERCOCET/ROXICET) 5-325 MG tablet Take 1 tablet by mouth every 6 (six) hours as needed.     No current facility-administered medications for this visit.     Musculoskeletal: Strength & Muscle Tone: within normal limits Gait & Station: normal Patient leans: N/A  Psychiatric Specialty Exam: Review of Systems  There were no vitals taken for this visit.There is no height or weight on file to calculate BMI.  General Appearance: {Appearance:22683}  Eye Contact:  {BHH EYE CONTACT:22684}  Speech:  Clear and Coherent  Volume:  Normal  Mood:  {BHH MOOD:22306}  Affect:  {Affect (PAA):22687}  Thought Process:  Coherent  Orientation:  Full (Time, Place, and Person)  Thought Content: Logical   Suicidal Thoughts:  {ST/HT (PAA):22692}  Homicidal Thoughts:  {ST/HT (PAA):22692}  Memory:  Immediate;   Good  Judgement:  {Judgement (PAA):22694}  Insight:  {Insight (PAA):22695}  Psychomotor Activity:  Normal  Concentration:  Concentration: Good and Attention Span: Good  Recall:  Good  Fund of Knowledge: Good  Language: Good  Akathisia:  No  Handed:  Right  AIMS (if indicated): not done  Assets:  Communication Skills Desire for Improvement  ADL's:  Intact  Cognition: WNL  Sleep:  {BHH GOOD/FAIR/POOR:22877}    Screenings: GAD-7    Flowsheet Row Office Visit from 12/27/2022 in [facility] Office Visit from 10/26/2022 in [facility]  Total GAD-7 Score 4 4   PHQ2-9    Flowsheet Row Office Visit from 12/27/2022 in [facility] Office Visit from 10/26/2022 in [facility]  PHQ-2 Total Score 2 3  PHQ-9 Total Score 2 5     Assessment and Plan:  Rodney Morrison is a 53 y.o. year old male with a history of bipolar disorder vs depression, who is referred for the follow up for below.   1. Mood disorder in conditions classified elsewhere Acute stressors include: conflict with his mother  Other stressors include: back pain,  unemployment, his mother with bipolar disorder, gambling    History:Tx from Dr. Kandyce under bipolar vs depression. Depressive symptoms since age 3 . He has a family history of bipolar d/o, although he denies any manic symptoms. Had episode of delusion about radiation in 2019, and olanzapine  was uptitrated from 30 mg. Admitted to [facility] due to depression in the context of cocaine use. originally on Lamotrigine  100 mg twice a day olanzapine  40 mg daily Exam is notable for brighter affect, and he denies any significant mood symptoms since her last visit.  Will continue current dose of olanzapine  and lamotrigine  for depression given he reports significant benefit from the current medication regimen. Noted that although there is a chart diagnosis of bipolar disorder, he denies any mania or hypomania in the past.  Will continue to assess   2. High risk medication use He was advised again to obtain EKG to monitor QTc prolongation.    # hypertension According to the chart review, Dr. Auston is trying to prescribe hydralazine, although he is not aware of this.  He was advised to discuss this with his primary care provider.         Last checked  EKG HR , QTcmsec    Lipid panels LDL 130, Chol 208 01/2023  YaJ8r Glu 98 01/2023       Continue Lamotrigine  100 mg twice a day - [facility] Continue olanzapine  40 mg daily - monitor weight gain - [facility] Next appointment- 5/29 at 3 pm for 30 mins, IP Obtain EKG - please call [phone] to make an appointment      The patient demonstrates the following risk factors for suicide: Chronic risk factors for suicide include: psychiatric disorder of depression and substance use disorder. Acute risk factors for suicide include: unemployment. Protective factors for this patient include: positive social support, responsibility to others (children, family), coping skills, and hope for the future. Considering these factors, the overall suicide risk at this point appears to be low. Patient is appropriate for outpatient follow up.     Collaboration of Care: Collaboration of Care: {BH OP Collaboration of Care:21014065}  Patient/Guardian was advised Release of Information must be obtained prior to any record release in order to collaborate their care with an outside provider. Patient/Guardian was advised if they have not already done so to contact the registration department to sign all necessary forms in order for us  to release information regarding their care.   Consent: Patient/Guardian gives verbal consent for treatment and assignment of benefits for services provided during this visit. Patient/Guardian expressed understanding and agreed to proceed.    Katheren Sleet, MD 07/17/2023, 2:14 PM

## 2023-07-21 ENCOUNTER — Encounter: Payer: Self-pay | Admitting: Psychiatry

## 2023-07-21 ENCOUNTER — Ambulatory Visit: Admitting: Psychiatry

## 2023-07-21 VITALS — BP 148/84 | HR 77 | Temp 98.4°F | Ht 74.0 in | Wt 221.2 lb

## 2023-07-21 DIAGNOSIS — F419 Anxiety disorder, unspecified: Secondary | ICD-10-CM | POA: Diagnosis not present

## 2023-07-21 DIAGNOSIS — Z79899 Other long term (current) drug therapy: Secondary | ICD-10-CM

## 2023-07-21 DIAGNOSIS — F0630 Mood disorder due to known physiological condition, unspecified: Secondary | ICD-10-CM

## 2023-07-21 MED ORDER — OLANZAPINE 20 MG PO TABS: 20.0000 mg | ORAL_TABLET | Freq: Two times a day (BID) | ORAL | 0 refills | Status: DC

## 2023-07-21 NOTE — Patient Instructions (Signed)
 Continue Lamotrigine  100 mg twice a day - [facility] Continue olanzapine  40 mg daily - monitor weight gain - [facility] Next appointment- 9/30 at 8 AM, IP Obtain EKG - please call [phone] to make an appointment

## 2023-09-16 NOTE — Progress Notes (Signed)
 Referring Physician:  Auston Rodney BIRCH, MD [address] [facility] [place],  [place] [address]  Primary Physician:  Rodney Rodney BIRCH, MD  History of Present Illness: 09/20/2023 [name] has a history of depression, bipolar disorder, HTN, hyperlipidemia.   History of PSF L5-S1 in years ago. He had relief after surgery, pain has been worse in last 5-6 years.   He has constant LBP with no radicular leg pain that has been worse in last 2 years. He has some right knee pain. No numbness, tingling, or weakness. Pain is worse with any increased activity- walking, mowing, cooking. Some relief with grocery cart and stopping to sit.   He is taking oxycodone- one a day from Dr. Auston.   Tobacco use: smokes 1-2 PPD x 35 years.   Bowel/Bladder Dysfunction: none  Conservative measures:  Physical therapy: has had in the past, none recently Multimodal medical therapy including regular antiinflammatories: ibuprofen, oxycodone,  Injections: has had nerve blocks in the past, these really didn't help  Past Surgery:  PSF L5-S1, about 16-17 years ago  Rodney Morrison has no symptoms of cervical myelopathy.  The symptoms are causing a significant impact on the patient's life.   Review of Systems:  A 10 point review of systems is negative, except for the pertinent positives and negatives detailed in the HPI.  Past Medical History: Past Medical History:  Diagnosis Date   Anxiety    Depression     Past Surgical History: Past Surgical History:  Procedure Laterality Date   BACK SURGERY      Allergies: Allergies as of 09/20/2023 - Review Complete 09/20/2023  Allergen Reaction Noted   Prednisone Other (See Comments) 05/06/2021    Medications: Outpatient Encounter Medications as of 09/20/2023  Medication Sig   ibuprofen (ADVIL) 200 MG tablet Take by mouth once.   lamoTRIgine  (LAMICTAL ) 100 MG tablet Take 1 tablet (100 mg total) by mouth 2 (two) times daily.    OLANZapine  (ZYPREXA ) 20 MG tablet Take 1 tablet (20 mg total) by mouth in the morning and at bedtime.   oxyCODONE-acetaminophen (PERCOCET/ROXICET) 5-325 MG tablet Take 1 tablet by mouth every 6 (six) hours as needed.   [DISCONTINUED] amLODipine (NORVASC) 5 MG tablet Take 5 mg by mouth daily.   [DISCONTINUED] hydrALAZINE (APRESOLINE) 25 MG tablet Take 25 mg by mouth 2 (two) times daily.   [DISCONTINUED] rosuvastatin (CRESTOR) 10 MG tablet Take 10 mg by mouth daily.   No facility-administered encounter medications on file as of 09/20/2023.    Social History: Social History   Tobacco Use   Smoking status: Every Day    Current packs/day: 1.00    Average packs/day: 1 pack/day for 39.2 years (39.2 ttl pk-yrs)    Types: Cigarettes    Start date: 07/22/1984   Smokeless tobacco: Never  Vaping Use   Vaping status: Never Used  Substance Use Topics   Alcohol use: Not Currently    Alcohol/week: 1.0 standard drink of alcohol    Types: 1 Cans of beer per week   Drug use: No    Family Medical History: Family History  Problem Relation Age of Onset   Bipolar disorder Mother    Drug abuse Cousin     Physical Examination: Vitals:   09/20/23 1412  BP: (!) 150/98    General: Patient is well developed, well nourished, calm, collected, and in no apparent distress. Attention to examination is appropriate.  Respiratory: Patient is breathing without any difficulty.  NEUROLOGICAL:     Awake, alert, oriented to person, place, and time.  Speech is clear and fluent. Fund of knowledge is appropriate.   Cranial Nerves: Pupils equal round and reactive to light.  Facial tone is symmetric.    Well healed lumbar incision  No abnormal lesions on exposed skin.   Strength: Side Biceps Triceps Deltoid Interossei Grip Wrist Ext. Wrist Flex.  R 5 5 5 5 5 5 5   L 5 5 5 5 5 5 5    Side Iliopsoas Quads Hamstring PF DF EHL  R 5 5 5 5 5 5   L 5 5 5 5 5 5    Reflexes are 2+ and symmetric at the biceps,  brachioradialis, patella and achilles.   Hoffman's is absent.  Clonus is not present.   Bilateral upper and lower extremity sensation is intact to light touch.     No pain with IR/ER of both hips.   Gait is normal.     Medical Decision Making  Imaging: none  Assessment and Plan: [name] has a history of PSF L5-S1 in years ago. He had relief after surgery, pain has been worse in last 5-6 years.   He has constant LBP with no radicular leg pain that has been worse in last 2 years. No numbness, tingling, or weakness. Pain is worse with any increased activity- walking, mowing, cooking. Some relief with grocery cart and stopping to sit.   No recent lumbar imaging.   Treatment options discussed with patient and following plan made:   - MRI of lumbar spine to further evaluate chronic LBP.  - Will get lumbar xrays with flex/ext when he gets lumbar MRI.  - He is interested in lumbar MBB/RFA. Will revisit once we have imaging.  - Will schedule phone visit to review MRI results once I get them back. Can do phone or in person visit.   BP was elevated. No symptoms of chest pain, shortness of breath, blurry vision, or headaches. Recommend that he recheck at home and call PCP if not improved. If he develops CP, SOB, blurry vision, or headaches, then he will go to ED.     PCP note reviewed from 06/29/23- he refused BP medications. He states today that PCP is aware of his elevated blood pressure.   I spent a total of 35 minutes in face-to-face and non-face-to-face activities related to this patient's care today including review of outside records, review of imaging, review of symptoms, physical exam, discussion of differential diagnosis, discussion of treatment options, and documentation.   Thank you for involving me in the care of this patient.   Glade Boys PA-C Dept. of Neurosurgery

## 2023-09-20 ENCOUNTER — Encounter: Payer: Self-pay | Admitting: Orthopedic Surgery

## 2023-09-20 ENCOUNTER — Ambulatory Visit: Admitting: Orthopedic Surgery

## 2023-09-20 VITALS — BP 164/100 | Ht 74.0 in | Wt 222.0 lb

## 2023-09-20 DIAGNOSIS — M545 Low back pain, unspecified: Secondary | ICD-10-CM

## 2023-09-20 DIAGNOSIS — Z981 Arthrodesis status: Secondary | ICD-10-CM

## 2023-09-20 DIAGNOSIS — G8929 Other chronic pain: Secondary | ICD-10-CM

## 2023-09-20 NOTE — Patient Instructions (Signed)
 It was so nice to see you today. Thank you so much for coming in.    I want to get an MRI of your lower back to look into things further. We will get this approved through your insurance and [facility] will call you to schedule the appointment. Ask about your patient responsibility. You do not need to pay this prior to getting MRI, they can bill you.   When you have MRI done, remind them to do your lower back xrays as well.   [facility] (building with the white pillars) is located off of [address]. The address is [address] [place], [place] [address].    After you have the MRI and xrays, it can take 14-28 days for me to get the results back. If I don't have them in 2 weeks, we will call to try to get the results.   Once I have the results, we will call you to schedule a follow up visit with me to review them. Can do in person visit or phone visit.   Once we get the imaging, we can see if you are a candidate for the radiofrequency ablation (burning of nerves of joints in your back).   Your blood pressure was elevated today. I recommend that you recheck it at home and follow up with your PCP if it remains high. If you have any chest pain, shortness of breath, blurry vision, or headaches then you need to go to ED.    Please do not hesitate to call if you have any questions or concerns. You can also message me in MyChart.   Glade Boys PA-C [phone]     The physicians and staff at [facility] at [facility] are committed to providing excellent care. You may receive a survey asking for feedback about your experience at our office. We value you your feedback and appreciate you taking the time to to fill it out. The [facility] leadership team is also available to discuss your experience in person, feel free to contact us  [phone].

## 2023-10-04 ENCOUNTER — Other Ambulatory Visit: Payer: Self-pay | Admitting: Psychiatry

## 2023-10-05 ENCOUNTER — Ambulatory Visit
Admission: RE | Admit: 2023-10-05 | Discharge: 2023-10-05 | Disposition: A | Discharge status: Home / Self Care | Source: Ambulatory Visit | Attending: Orthopedic Surgery | Admitting: Orthopedic Surgery

## 2023-10-05 DIAGNOSIS — Z981 Arthrodesis status: Secondary | ICD-10-CM | POA: Diagnosis present

## 2023-10-05 DIAGNOSIS — M545 Low back pain, unspecified: Secondary | ICD-10-CM | POA: Diagnosis present

## 2023-10-05 DIAGNOSIS — G8929 Other chronic pain: Secondary | ICD-10-CM | POA: Insufficient documentation

## 2023-10-06 ENCOUNTER — Other Ambulatory Visit: Payer: Self-pay | Admitting: Psychiatry

## 2023-10-06 ENCOUNTER — Telehealth: Payer: Self-pay | Admitting: Psychiatry

## 2023-10-06 MED ORDER — OLANZAPINE 20 MG PO TABS: 20.0000 mg | ORAL_TABLET | Freq: Two times a day (BID) | ORAL | 0 refills | Status: DC

## 2023-10-06 NOTE — Telephone Encounter (Signed)
 Have made 3 attempts to call patients pharmacy have spent a total of 35 minutes being on hold and then disconnected called patient to make aware he stated that he would go to the pharmacy

## 2023-10-06 NOTE — Telephone Encounter (Signed)
 Lamotrigine - he should have a refill at the [facility] pharmacy. Please verify them. Also please ask to cancel olanzapine  order there (correct order was sent to [facility]).

## 2023-10-11 ENCOUNTER — Ambulatory Visit: Admitting: Psychiatry

## 2023-10-11 ENCOUNTER — Ambulatory Visit: Payer: Self-pay | Admitting: Orthopedic Surgery

## 2023-10-11 NOTE — Telephone Encounter (Deleted)
-----   Message from Edsel Jama Goods sent at 10/06/2023  7:18 PM EDT ----- Looks like this guys MRI results have come back for stacy but she also ordered xrays that he did not appear to go for. Can you please contact the patient and make sure he gets his xrays. Once those  are completed he will need a telephone follow up with Glade per her note, Thank you Danielle ----- Message ----- From: Interface, Rad Results In Sent: 10/05/2023  11:28 PM EDT To: Glade Boys, PA-C

## 2023-10-11 NOTE — Telephone Encounter (Signed)
 Patient's father was notified that the pt does need xrays and where to go- they will call us  once this is completed to schedule telephone visit

## 2023-10-12 ENCOUNTER — Ambulatory Visit
Admission: RE | Admit: 2023-10-12 | Discharge: 2023-10-12 | Disposition: A | Discharge status: Home / Self Care | Attending: Orthopedic Surgery | Admitting: Orthopedic Surgery

## 2023-10-12 ENCOUNTER — Ambulatory Visit
Admission: RE | Admit: 2023-10-12 | Discharge: 2023-10-12 | Disposition: A | Discharge status: Home / Self Care | Source: Ambulatory Visit | Attending: Orthopedic Surgery | Admitting: Orthopedic Surgery

## 2023-10-12 DIAGNOSIS — M545 Low back pain, unspecified: Secondary | ICD-10-CM | POA: Insufficient documentation

## 2023-10-12 DIAGNOSIS — G8929 Other chronic pain: Secondary | ICD-10-CM | POA: Insufficient documentation

## 2023-10-12 DIAGNOSIS — Z981 Arthrodesis status: Secondary | ICD-10-CM

## 2023-10-23 NOTE — Progress Notes (Unsigned)
 Referring Physician:  Auston Reyes BIRCH, MD [address] [facility] [place],  [place] [address]  Primary Physician:  Auston Reyes BIRCH, MD  History of Present Illness: [name] has a history of depression, bipolar disorder, HTN, hyperlipidemia.   History of PSF L5-S1 in years ago. He had relief after surgery, pain has been worse in last 5-6 years.   Last seen by me on 09/20/23 for constant LBP with no leg pain. He is here to review his lumbar MRI/xrays.   He is about the same. He continues with constant LBP with no radicular leg pain. No numbness, tingling, or weakness. Pain is worse with any increased activity- walking, mowing, cooking. Some relief with grocery cart and stopping to sit.   He is taking oxycodone- one a day from Dr. Auston.   Tobacco use: smokes 1-2 PPD x 35 years.   Bowel/Bladder Dysfunction: none  Conservative measures:  Physical therapy: has had in the past, none recently Multimodal medical therapy including regular antiinflammatories: ibuprofen, oxycodone,  Injections: has had nerve blocks in the past, these really didn't help  Past Surgery:  PSF L5-S1, about 16-17 years ago  Reyes DELENA Baptist has no symptoms of cervical myelopathy.  The symptoms are causing a significant impact on the patient's life.   Review of Systems:  A 10 point review of systems is negative, except for the pertinent positives and negatives detailed in the HPI.  Past Medical History: Past Medical History:  Diagnosis Date   Anxiety    Depression     Past Surgical History: Past Surgical History:  Procedure Laterality Date   BACK SURGERY      Allergies: Allergies as of 10/26/2023 - Review Complete 09/20/2023  Allergen Reaction Noted   Prednisone Other (See Comments) 05/06/2021    Medications: Outpatient Encounter Medications as of 10/26/2023  Medication Sig   ibuprofen (ADVIL) 200 MG tablet Take by mouth once.   lamoTRIgine  (LAMICTAL ) 100 MG tablet  Take 1 tablet (100 mg total) by mouth 2 (two) times daily.   OLANZapine  (ZYPREXA ) 20 MG tablet Take 1 tablet (20 mg total) by mouth in the morning and at bedtime.   oxyCODONE-acetaminophen (PERCOCET/ROXICET) 5-325 MG tablet Take 1 tablet by mouth every 6 (six) hours as needed.   No facility-administered encounter medications on file as of 10/26/2023.    Social History: Social History   Tobacco Use   Smoking status: Every Day    Current packs/day: 1.00    Average packs/day: 1 pack/day for 39.3 years (39.3 ttl pk-yrs)    Types: Cigarettes    Start date: 07/22/1984   Smokeless tobacco: Never  Vaping Use   Vaping status: Never Used  Substance Use Topics   Alcohol use: Not Currently    Alcohol/week: 1.0 standard drink of alcohol    Types: 1 Cans of beer per week   Drug use: No    Family Medical History: Family History  Problem Relation Age of Onset   Bipolar disorder Mother    Drug abuse Cousin     Physical Examination: There were no vitals filed for this visit.    Awake, alert, oriented to person, place, and time.  Speech is clear and fluent. Fund of knowledge is appropriate.   Well healed lumbar incision. No lower lumbar tenderness.   No abnormal lesions on exposed skin.   Strength: Side Iliopsoas Quads Hamstring PF DF EHL  R 5 5 5 5 5 5   L 5 5 5  5  5 5   Reflexes are 2+ and symmetric at the patella and achilles.    Clonus is not present.   Bilateral lower extremity sensation is intact to light touch.     No pain with IR/ER of both hips.   Gait is normal.     Medical Decision Making  Imaging: Lumbar xrays dated 10/12/23: FINDINGS: Normal alignment and preserved vertebral body heights. No acute compression fracture, wedge-shaped deformity or focal kyphosis. Preserved vertebral body heights. Postop changes again noted at L5-S1. Degenerative changes most pronounced at L1-2 and L2-3 with disc space narrowing, sclerosis and anterior osteophytes. Facets  are aligned. No instability with flexion and extension. SI joints are maintained.   IMPRESSION: Degenerative and postoperative changes as above. No acute finding by plain radiography.     Electronically Signed   By: CHRISTELLA.  Shick M.D.   On: 10/16/2023 19:24    Lumbar MRI dated 10/05/23:  FINDINGS: Segmentation: Standard. Lowest well-formed disc space labeled the L5-S1 level.   Alignment: Physiologic with preservation of the normal lumbar lordosis. No listhesis.   Vertebrae: Postoperative changes from prior interbody and interspinous fusion at L5-S1. Arthrodesis status better appreciated on prior CT from 2021. Vertebral body height maintained without acute or chronic fracture. Bone marrow signal intensity within normal limits. Few scattered benign hemangiomata noted, largest of which measures 2.5 cm within the left S1 segment. No worrisome osseous lesions. No abnormal marrow edema.   Conus medullaris and cauda equina: Conus extends to the L1 level. Conus and cauda equina appear normal.   Paraspinal and other soft tissues: Chronic postoperative scarring present within the lower posterior paraspinous soft tissues. No acute finding. Visualized visceral structures within normal limits.   Disc levels:   L1-2: Mild anterior endplate spurring. Tiny central disc protrusion minimally indents the ventral thecal sac (series 8, image 3). No spinal stenosis. Foramina remain patent.   L2-3: Anterior endplate spurring without significant disc bulge. Mild bilateral facet spurring. No spinal stenosis. Foramina remain patent.   L3-4: Mild anterior endplate spurring without significant disc bulge. Mild bilateral facet hypertrophy. No spinal stenosis. Foramina remain patent.   L4-5: Mild endplate spurring with minimal annular disc bulge. Mild facet and ligament flavum hypertrophy. Mild narrowing of the lateral recesses bilaterally. Central canal remains patent. Mild bilateral L4 foraminal  stenosis.   L5-S1: Degenerative intervertebral disc space narrowing. Sequelae of prior interbody and interspinous fusion. Endplate spurring, asymmetric to the left. Mild bilateral facet hypertrophy. No significant spinal stenosis. Lateral recesses appear patent. Moderate to severe left L5 foraminal narrowing, largely due to endplate spurring (series 7, image 12). Right neural foramina remains patent.   IMPRESSION: 1. Postoperative changes from prior interbody and interspinous fusion at L5-S1. Left-sided endplate spurring at this level with resultant moderate to severe left L5 foraminal stenosis. 2. Mild disc bulge with facet hypertrophy at L4-5 with resultant mild bilateral lateral recess stenosis, with mild bilateral L4 foraminal narrowing. 3. Tiny central disc protrusion at L1-2 without stenosis or impingement.     Electronically Signed   By: Morene Hoard M.D.   On: 10/05/2023 23:25     I have personally reviewed the images and agree with the above interpretation.  Assessment and Plan: Mr. Penza has a history of PSF L5-S1 in years ago. He had relief after surgery, pain has been worse in last 5-6 years.   He has constant LBP with no leg pain. No numbness, tingling, or weakness.   History of fusion L5-S1 as above, he has  diffuse facet hypertrophy/spondylosis L2-L4. Also with mild bilateral foraminal stenosis L4-L5.   LBP is likely due to facets/spondylosis.   Treatment options discussed with patient and following plan made:   - Referral to PMR at [facility] to consider lumbar injections. He is interested in MBB/RFA.  - Continue on percocet from PCP [facility]).  - Follow up with me in 6-8 weeks and prn.   BP was elevated this visit and his last visit. No symptoms of chest pain, shortness of breath, blurry vision, or headaches. Recommend that he recheck at home and call PCP if not improved. If he develops CP, SOB, blurry vision, or headaches, then he will go to ED.     PCP  note reviewed from 06/29/23- he refused BP medications. I advised him to discuss elevated BP with his PCP again.    I spent a total of 30 minutes in face-to-face and non-face-to-face activities related to this patient's care today including review of outside records, review of imaging, review of symptoms, physical exam, discussion of differential diagnosis, discussion of treatment options, and documentation.   Thank you for involving me in the care of this patient.   Glade Boys PA-C Dept. of Neurosurgery

## 2023-10-26 ENCOUNTER — Ambulatory Visit: Admitting: Orthopedic Surgery

## 2023-10-26 ENCOUNTER — Encounter: Payer: Self-pay | Admitting: Orthopedic Surgery

## 2023-10-26 VITALS — BP 170/108 | Ht 74.0 in | Wt 220.1 lb

## 2023-10-26 DIAGNOSIS — M47816 Spondylosis without myelopathy or radiculopathy, lumbar region: Secondary | ICD-10-CM | POA: Diagnosis not present

## 2023-10-26 DIAGNOSIS — M48061 Spinal stenosis, lumbar region without neurogenic claudication: Secondary | ICD-10-CM | POA: Diagnosis not present

## 2023-10-26 DIAGNOSIS — R030 Elevated blood-pressure reading, without diagnosis of hypertension: Secondary | ICD-10-CM

## 2023-10-26 DIAGNOSIS — Z981 Arthrodesis status: Secondary | ICD-10-CM

## 2023-10-26 NOTE — Patient Instructions (Signed)
 It was so nice to see you today. Thank you so much for coming in.    You have some wear and tear in your back in the facet joints. This is likely causing your back pain.   I want you to see physical medicine and rehab at the [facility] to discuss possible injections in your lower back. Dr. Avanell, Dr. Dodson, and their NP [place] are great and will take good care of you. They should call you to schedule an appointment or you can call them at [phone].   Your blood pressure was elevated today. I want you to recheck it at home and follow up with your PCP if it remains high. If you have any chest pain, shortness of breath, blurry vision, or headaches then you need to go to ED.    I will see you back in 6-8 weeks. Please do not hesitate to call if you have any questions or concerns. You can also message me in MyChart.   Glade Boys PA-C [phone]     The physicians and staff at [facility] are committed to providing excellent care. You may receive a survey asking for feedback about your experience at our office. We value you your feedback and appreciate you taking the time to to fill it out. The [facility] leadership team is also available to discuss your experience in person, feel free to contact us  [phone].

## 2023-11-05 NOTE — Progress Notes (Unsigned)
 [facility] MD/PA/NP OP Progress Note  11/07/2023 3:42 PM Rodney Morrison  MRN:  [id number]  Chief Complaint:  Chief Complaint  Patient presents with   Follow-up   HPI:  This is a follow-up appointment for mood disorder, anxiety.  He states that he is hoping to get procedure for his back.  He is hoping that it would help so that he does not feel hopeless.  He states that he had to put down his cat.  He is father struggling with back issues.  Although he does not communicate with his mother, he heard that she went back drinking alcohol and gambling.  He is also concerned about his half brother.  He feels sad that he does not have parents to take care of him.  He agrees that his brother to be connected with his primary care provider for additional resources.  He states that his mood has been pretty good.  He feels normal.  Although he feels anxious at times depends on the situation, it has been manageable.  He denies SI, HI, hallucinations.  He denies decreased need for sleep or euphoria.  He feels comfortable to stay on the current medication.   Wt Readings from Last 3 Encounters:  11/07/23 220 lb 9.6 oz (100.1 kg)  10/26/23 220 lb 2 oz (99.8 kg)  09/20/23 222 lb (100.7 kg)      Substance use   Tobacco Alcohol Other substances/  Current  2 PPD denies Denies (four cups of coffee, two pepsi to feel high when he is bored)  Past   denies Cocaine, last in 2008 (never addicted  Past Treatment  bupropion , nicotine patch, varenicline           Support: father Household: father (with COPD, Afib, 55 yo) Marital status: single Number of children: 0  Employment: on disability, unemployed, used to work in [employer], social research officer, government, carpentry Education: [employer] two years He was raised by his mother and his stepfather.  He reports good relationship with them as a child, and describes his stepfather as a good guy.  Her mother is dominant, and his stepfather becomes more dominant when he earns more  money.  His mother suffers from bipolar disorder, and later has issues with a gambling.  He states that his mother is good at manipulating a man.    Wt Readings from Last 3 Encounters:  11/07/23 220 lb 9.6 oz (100.1 kg)  10/26/23 220 lb 2 oz (99.8 kg)  09/20/23 222 lb (100.7 kg)     Visit Diagnosis:    ICD-10-CM   1. Mood disorder in conditions classified elsewhere  F06.30     2. Anxiety disorder, unspecified type  F41.9       Past Psychiatric History: Please see initial evaluation for full details. I have reviewed the history. No updates at this time.     Past Medical History:  Past Medical History:  Diagnosis Date   Anxiety    Depression     Past Surgical History:  Procedure Laterality Date   BACK SURGERY      Family Psychiatric History: Please see initial evaluation for full details. I have reviewed the history. No updates at this time.     Family History:  Family History  Problem Relation Age of Onset   Bipolar disorder Mother    Drug abuse Cousin     Social History:  Social History   Socioeconomic History   Marital status: Single    Spouse name: Not on file  Number of children: 0   Years of education: Not on file   Highest education level: Some college, no degree  Occupational History    Comment: disabled  Tobacco Use   Smoking status: Every Day    Current packs/day: 1.00    Average packs/day: 1 pack/day for 39.3 years (39.3 ttl pk-yrs)    Types: Cigarettes    Start date: 07/22/1984   Smokeless tobacco: Never  Vaping Use   Vaping status: Never Used  Substance and Sexual Activity   Alcohol use: Not Currently    Alcohol/week: 1.0 standard drink of alcohol    Types: 1 Cans of beer per week   Drug use: No   Sexual activity: Not Currently  Other Topics Concern   Not on file  Social History Narrative   Not on file   Social Drivers of Health   Financial Resource Strain: Low Risk  (06/29/2023)   Received from [facility]    Overall Financial Resource Strain (CARDIA)    Difficulty of Paying Living Expenses: Not hard at all  Food Insecurity: No Food Insecurity (06/29/2023)   Received from [facility]   Hunger Vital Sign    Within the past 12 months, you worried that your food would run out before you got the money to buy more.: Never true    Within the past 12 months, the food you bought just didn't last and you didn't have money to get more.: Never true  Transportation Needs: No Transportation Needs (06/29/2023)   Received from [facility] - Transportation    In the past 12 months, has lack of transportation kept you from medical appointments or from getting medications?: No    Lack of Transportation (Non-Medical): No  Physical Activity: Sufficiently Active (12/16/2016)   Exercise Vital Sign    Days of Exercise per Week: 5 days    Minutes of Exercise per Session: 30 min  Stress: No Stress Concern Present (12/16/2016)   [employer] of Occupational Health - Occupational Stress Questionnaire    Feeling of Stress : Not at all  Social Connections: Moderately Isolated (12/16/2016)   Social Connection and Isolation Panel    Frequency of Communication with Friends and Family: More than three times a week    Frequency of Social Gatherings with Friends and Family: Twice a week    Attends Religious Services: Never    Database Administrator or Organizations: No    Attends Banker Meetings: Never    Marital Status: Never married    Allergies:  Allergies  Allergen Reactions   Prednisone Other (See Comments)    Increased heart rate .    Metabolic Disorder Labs: No results found for: HGBA1C, MPG No results found for: PROLACTIN No results found for: CHOL, TRIG, HDL, CHOLHDL, VLDL, LDLCALC No results found for: TSH  Therapeutic Level Labs: No results found for: LITHIUM  No results found for: VALPROATE No results found for:  CBMZ  Current Medications: Current Outpatient Medications  Medication Sig Dispense Refill   ibuprofen (ADVIL) 200 MG tablet Take by mouth once.     lamoTRIgine  (LAMICTAL ) 100 MG tablet Take 1 tablet (100 mg total) by mouth 2 (two) times daily. 180 tablet 1   OLANZapine  (ZYPREXA ) 20 MG tablet Take 1 tablet (20 mg total) by mouth in the morning and at bedtime. 180 tablet 0   oxyCODONE-acetaminophen (PERCOCET/ROXICET) 5-325 MG tablet Take 1 tablet by mouth every 6 (six) hours  as needed.     No current facility-administered medications for this visit.     Musculoskeletal: Strength & Muscle Tone: within normal limits Gait & Station: normal Patient leans: N/A  Psychiatric Specialty Exam: Review of Systems  Psychiatric/Behavioral:  Negative for agitation, behavioral problems, confusion, decreased concentration, dysphoric mood, hallucinations, self-injury, sleep disturbance and suicidal ideas. The patient is nervous/anxious. The patient is not hyperactive.   All other systems reviewed and are negative.   Blood pressure (!) 186/119, pulse 78, temperature (!) 97.4 F (36.3 C), temperature source Temporal, height 6' 2 (1.88 m), weight 220 lb 9.6 oz (100.1 kg).Body mass index is 28.32 kg/m.  General Appearance: Well Groomed  Eye Contact:  Good  Speech:  Clear and Coherent  Volume:  Normal  Mood:  good  Affect:  Appropriate, Congruent, and calm  Thought Process:  Coherent  Orientation:  Full (Time, Place, and Person)  Thought Content: Logical   Suicidal Thoughts:  No  Homicidal Thoughts:  No  Memory:  Immediate;   Good  Judgement:  Good  Insight:  Good  Psychomotor Activity:  Normal, Normal tone, no rigidity, no resting/postural tremors, no tardive dyskinesia    Concentration:  Concentration: Good and Attention Span: Good  Recall:  Good  Fund of Knowledge: Good  Language: Good  Akathisia:  No  Handed:  Right  AIMS (if indicated): 0   Assets:  Communication Skills  ADL's:   Intact  Cognition: WNL  Sleep:  Fair   Screenings: GAD-7    Flowsheet Row Office Visit from 11/07/2023 in [facility] Office Visit from 12/27/2022 in [facility] Office Visit from 10/26/2022 in [facility]  Total GAD-7 Score 4 4 4    PHQ2-9    Flowsheet Row Office Visit from 11/07/2023 in [facility] Office Visit from 12/27/2022 in [facility] Office Visit from 10/26/2022 in [facility]  PHQ-2 Total Score 1 2 3   PHQ-9 Total Score -- 2 5     Assessment and Plan:  SARAH ZERBY is a 53 y.o. year old male with a history of bipolar disorder vs depression, who is referred for the follow up for below.   1. Mood disorder in conditions classified elsewhere 2. Anxiety disorder, unspecified type Acute stressors include: conflict with his mother  Other stressors include: back pain, unemployment, his mother with bipolar disorder, gambling    History:Tx from Dr. Kandyce under bipolar vs depression. Depressive symptoms since age 38 . He has a family history of bipolar d/o, although he denies any manic symptoms. Had episode of delusion about radiation in 2019, and olanzapine  was uptitrated from 30 mg. Admitted to [facility] due to depression in the context of cocaine use. originally on Lamotrigine  100 mg twice a day olanzapine  40 mg daily  Although he reports stress and issues related to his family, his mood has been consistent, and he denies any significant depressive symptoms or anxiety since the previous visit.  Will maintain on the current medication regimen given he reports significant benefit from the current combination.  Will continue lamotrigine  and olanzapine  for depression, mood dysregulation, and anxiety, off label.  Also he will greatly benefit from CBT,  he is not interested in this.  Noted that although there is a chart diagnosis of bipolar disorder, he denies any mania or hypomania in the past.  Will continue to assess  2. High risk medication use He was advised again to obtain EKG to monitor QTc prolongation.        Last checked  EKG HR , QTcmsec    Lipid panels LDL 130, Chol 208 01/2023  YaJ8r Glu 98 01/2023       Continue Lamotrigine  100 mg twice a day - [facility] Continue olanzapine  40 mg daily - monitor weight gain - [facility] Obtain EKG - please call [phone] to make an appointment  Next appointment- 1/12 at 3 pm for 30 mins, IP   The patient demonstrates the following risk factors for suicide: Chronic risk factors for suicide include: psychiatric disorder of depression and substance use disorder. Acute risk factors for suicide include: unemployment. Protective factors for this patient include: positive social support, responsibility to others (children, family), coping skills, and hope for the future. Considering these factors, the overall suicide risk at this point appears to be low. Patient is appropriate for outpatient follow up.     Collaboration of Care: Collaboration of Care: Other reviewed notes in Epic  Patient/Guardian was advised Release of Information must be obtained prior to any record release in order to collaborate their care with an outside provider. Patient/Guardian was advised if they have not already done so to contact the registration department to sign all necessary forms in order for us  to release information regarding their care.   Consent: Patient/Guardian gives verbal consent for treatment and assignment of benefits for services provided during this visit. Patient/Guardian expressed understanding and agreed to proceed.    Katheren Sleet, MD 11/07/2023, 3:42 PM

## 2023-11-07 ENCOUNTER — Ambulatory Visit (INDEPENDENT_AMBULATORY_CARE_PROVIDER_SITE_OTHER): Admitting: Psychiatry

## 2023-11-07 ENCOUNTER — Other Ambulatory Visit: Payer: Self-pay

## 2023-11-07 ENCOUNTER — Encounter: Payer: Self-pay | Admitting: Psychiatry

## 2023-11-07 VITALS — BP 186/119 | HR 78 | Temp 97.4°F | Ht 74.0 in | Wt 220.6 lb

## 2023-11-07 DIAGNOSIS — F419 Anxiety disorder, unspecified: Secondary | ICD-10-CM

## 2023-11-07 DIAGNOSIS — F0630 Mood disorder due to known physiological condition, unspecified: Secondary | ICD-10-CM

## 2023-12-16 NOTE — Progress Notes (Unsigned)
 Referring Physician:  Auston Rodney BIRCH, MD [address] [facility] [place],  [place] [address]  Primary Physician:  Auston Rodney BIRCH, MD  History of Present Illness: [name] has a history of depression, bipolar disorder, HTN, hyperlipidemia.   History of PSF L5-S1 in years ago. He had relief after surgery, pain has been worse in last 5-6 years.   Last seen by me on 10/26/23 for constant LBP with no leg pain. History of fusion L5-S1 as above, he has diffuse facet hypertrophy/spondylosis L2-L4. Also with mild bilateral foraminal stenosis L4-L5.   He was sent to PMR at [facility] to discuss possible MBB/RFA.   He is here for follow up.   He had bilateral L2-L4 MBB with Dr. Avanell on 11/24/23 and on 12/16/23. He has RFA scheuled for 01/25/24.   He did well with MBB- pain went from an 8-9 to 1-2 after procedures.   He is back to his baseline with constant LBP with no radicular leg pain. No numbness, tingling, or weakness. Pain is worse with any increased activity- walking, mowing, cooking. Some relief with grocery cart and stopping to sit.   He is taking oxycodone- from Dr. Auston.   Tobacco use: smokes 1-2 PPD x 35 years.   Bowel/Bladder Dysfunction: none  Conservative measures:  Physical therapy: has had in the past, none recently Multimodal medical therapy including regular antiinflammatories: ibuprofen, oxycodone,  Injections:  12/16/2023: MBB to the bilateral L3-4 and L4-5 facet joints (8/10 to 1/10)  11/24/2023: MBB to the bilateral L3-4 and L4-5 facet joints (8/10 to 1/10)   Past Surgery:  PSF L5-S1, about 16-17 years ago  Rodney Morrison has no symptoms of cervical myelopathy.  The symptoms are causing a significant impact on the patient's life.   Review of Systems:  A 10 point review of systems is negative, except for the pertinent positives and negatives detailed in the HPI.  Past Medical History: Past Medical History:  Diagnosis Date    Anxiety    Depression     Past Surgical History: Past Surgical History:  Procedure Laterality Date   BACK SURGERY      Allergies: Allergies as of 12/21/2023 - Review Complete 12/21/2023  Allergen Reaction Noted   Prednisone Other (See Comments) 05/06/2021    Medications: Outpatient Encounter Medications as of 12/21/2023  Medication Sig   ibuprofen (ADVIL) 200 MG tablet Take by mouth once.   lamoTRIgine  (LAMICTAL ) 100 MG tablet Take 1 tablet (100 mg total) by mouth 2 (two) times daily.   OLANZapine  (ZYPREXA ) 20 MG tablet Take 1 tablet (20 mg total) by mouth in the morning and at bedtime.   oxyCODONE-acetaminophen (PERCOCET/ROXICET) 5-325 MG tablet Take 1 tablet by mouth every 6 (six) hours as needed.   No facility-administered encounter medications on file as of 12/21/2023.    Social History: Social History   Tobacco Use   Smoking status: Every Day    Current packs/day: 1.00    Average packs/day: 1 pack/day for 39.4 years (39.4 ttl pk-yrs)    Types: Cigarettes    Start date: 07/22/1984   Smokeless tobacco: Never  Vaping Use   Vaping status: Never Used  Substance Use Topics   Alcohol use: Not Currently    Alcohol/week: 1.0 standard drink of alcohol    Types: 1 Cans of beer per week   Drug use: No    Family Medical History: Family History  Problem Relation Age of Onset   Bipolar disorder Mother  Drug abuse Cousin     Physical Examination: Vitals:   12/21/23 1026  BP: (!) 150/92      Awake, alert, oriented to person, place, and time.  Speech is clear and fluent. Fund of knowledge is appropriate.   No abnormal lesions on exposed skin.   Strength: Side Iliopsoas Quads Hamstring PF DF EHL  R 5 5 5 5 5 5   L 5 5 5 5 5 5    Clonus is not present.   Bilateral lower extremity sensation is intact to light touch.     No pain with IR/ER of both hips.   Gait is normal.     Medical Decision Making  Imaging: None   Assessment and Plan: Rodney Morrison has a  history of PSF L5-S1 in years ago. He had relief after surgery, pain has been worse in last 5-6 years.   He has constant LBP with no leg pain. No numbness, tingling, or weakness.   History of fusion L5-S1 as above, he has diffuse facet hypertrophy/spondylosis L2-L4. Also with mild bilateral foraminal stenosis L4-L5.   LBP is likely due to facets/spondylosis. He has done well with lumbar MBB.   Treatment options discussed with patient and following plan made:   - Agree with lumbar RFA scheduled on 01/25/24 - Continue on percocet from PCP [facility]).  - Follow up with me prn at his request. We will call him in February to check on him.   BP was elevated this visit and his previous visits. No symptoms of chest pain, shortness of breath, blurry vision, or headaches.   He declines BP recheck at end of visit.   Recommend that he recheck at home and call PCP if not improved. He is working with his PCP on BP- previous meds interacted with his other medications. He has f/u with PCP on 12/29/23 and will discuss again with him.   If he develops CP, SOB, blurry vision, or headaches, then he will go to ED.     I spent a total of 15 minutes in face-to-face and non-face-to-face activities related to this patient's care today including review of outside records, review of imaging, review of symptoms, physical exam, discussion of differential diagnosis, discussion of treatment options, and documentation.   Glade Boys PA-C Dept. of Neurosurgery

## 2023-12-21 ENCOUNTER — Encounter: Payer: Self-pay | Admitting: Orthopedic Surgery

## 2023-12-21 ENCOUNTER — Ambulatory Visit (INDEPENDENT_AMBULATORY_CARE_PROVIDER_SITE_OTHER): Admitting: Orthopedic Surgery

## 2023-12-21 VITALS — BP 150/92 | Wt 220.0 lb

## 2023-12-21 DIAGNOSIS — M47816 Spondylosis without myelopathy or radiculopathy, lumbar region: Secondary | ICD-10-CM | POA: Diagnosis not present

## 2023-12-21 DIAGNOSIS — Z981 Arthrodesis status: Secondary | ICD-10-CM | POA: Diagnosis not present

## 2023-12-21 DIAGNOSIS — M48061 Spinal stenosis, lumbar region without neurogenic claudication: Secondary | ICD-10-CM | POA: Diagnosis not present

## 2024-01-02 ENCOUNTER — Other Ambulatory Visit: Payer: Self-pay | Admitting: Psychiatry

## 2024-01-02 ENCOUNTER — Telehealth: Payer: Self-pay | Admitting: Psychiatry

## 2024-01-02 MED ORDER — OLANZAPINE 20 MG PO TABS: 20.0000 mg | ORAL_TABLET | Freq: Two times a day (BID) | ORAL | 0 refills | Status: DC

## 2024-01-02 MED ORDER — LAMOTRIGINE 100 MG PO TABS: 100.0000 mg | ORAL_TABLET | Freq: Two times a day (BID) | ORAL | 1 refills | Status: AC

## 2024-01-17 NOTE — Progress Notes (Signed)
 [facility] MD/PA/NP OP Progress Note  01/23/2024 3:27 PM Rodney Morrison  MRN:  [id number]  Chief Complaint:  Chief Complaint  Patient presents with   Follow-up   HPI:  This is a follow-up appointment for mood disorder, anxiety.  He excuses himself to keep standing due to his back pain.  He states that he suffers from back pain for 4 years, and is now scheduled for ablation for his back.  He reports frustration that nobody recommended this procedure until the 10th visit.  He feels depressed as he is unable to do things.  He may take a walk.  He used to be very active, enjoying basketball.  He is now just sitting in recliner and drinking 3 cups of coffee.  He has not contacted his mother.  He has been sleeping well.  He denies change in appetite.  He denies SI, HI, hallucinations.  He denies decreased need for sleep or euphoria.  His anxiety is manageable.  He feels hopeful for the procedure, and prefers to stay on the current medication regimen.    Wt Readings from Last 3 Encounters:  01/23/24 224 lb 9.6 oz (101.9 kg)  12/21/23 220 lb (99.8 kg)  11/07/23 220 lb 9.6 oz (100.1 kg)     Substance use   Tobacco Alcohol Other substances/  Current  2 PPD denies Denies (four cups of coffee, two pepsi to feel high when he is bored)  Past   denies Cocaine, last in 2008 (never addicted  Past Treatment  bupropion , nicotine patch, varenicline           Support: father Household: father (with COPD, Afib, 14 yo) Marital status: single Number of children: 0  Employment: on disability, unemployed, used to work in [employer], social research officer, government, carpentry Education: [employer] two years He was raised by his mother and his stepfather.  He reports good relationship with them as a child, and describes his stepfather as a good guy.  Her mother is dominant, and his stepfather becomes more dominant when he earns more money.  His mother suffers from bipolar disorder, and later has issues with a gambling.  He states  that his mother is good at manipulating a man.     Visit Diagnosis:    ICD-10-CM   1. Mood disorder in conditions classified elsewhere  F06.30     2. Anxiety disorder, unspecified type  F41.9     3. High risk medication use  Z79.899       Past Psychiatric History: Please see initial evaluation for full details. I have reviewed the history. No updates at this time.     Past Medical History:  Past Medical History:  Diagnosis Date   Anxiety    Depression     Past Surgical History:  Procedure Laterality Date   BACK SURGERY      Family Psychiatric History: Please see initial evaluation for full details. I have reviewed the history. No updates at this time.     Family History:  Family History  Problem Relation Age of Onset   Bipolar disorder Mother    Drug abuse Cousin     Social History:  Social History   Socioeconomic History   Marital status: Single    Spouse name: Not on file   Number of children: 0   Years of education: Not on file   Highest education level: Some college, no degree  Occupational History    Comment: disabled  Tobacco Use   Smoking status: Every Day  Current packs/day: 1.00    Average packs/day: 1 pack/day for 39.5 years (39.5 ttl pk-yrs)    Types: Cigarettes    Start date: 07/22/1984   Smokeless tobacco: Never  Vaping Use   Vaping status: Never Used  Substance and Sexual Activity   Alcohol use: Not Currently    Alcohol/week: 1.0 standard drink of alcohol    Types: 1 Cans of beer per week   Drug use: No   Sexual activity: Not Currently  Other Topics Concern   Not on file  Social History Narrative   Not on file   Social Drivers of Health   Tobacco Use: High Risk (01/23/2024)   Patient History    Smoking Tobacco Use: Every Day    Smokeless Tobacco Use: Never    Passive Exposure: Not on file  Financial Resource Strain: Low Risk  (11/08/2023)   Received from [facility]   Overall Financial Resource Strain  (CARDIA)    Difficulty of Paying Living Expenses: Not very hard  Food Insecurity: No Food Insecurity (11/08/2023)   Received from [facility]   Epic    Within the past 12 months, you worried that your food would run out before you got the money to buy more.: Never true    Within the past 12 months, the food you bought just didn't last and you didn't have money to get more.: Never true  Transportation Needs: No Transportation Needs (11/08/2023)   Received from [facility] - Transportation    In the past 12 months, has lack of transportation kept you from medical appointments or from getting medications?: No    Lack of Transportation (Non-Medical): No  Physical Activity: Not on file  Stress: Not on file  Social Connections: Not on file  Depression (PHQ2-9): Low Risk (11/07/2023)   Depression (PHQ2-9)    PHQ-2 Score: 1  Alcohol Screen: Not on file  Housing: Low Risk  (12/16/2023)   Received from [facility]   Epic    In the last 12 months, was there a time when you were not able to pay the mortgage or rent on time?: No    In the past 12 months, how many times have you moved where you were living?: 0    At any time in the past 12 months, were you homeless or living in a shelter (including now)?: No  Utilities: Not At Risk (11/08/2023)   Received from [facility]   Epic    In the past 12 months has the electric, gas, oil, or water company threatened to shut off services in your home?: No  Health Literacy: Not on file    Allergies: Allergies[1]  Metabolic Disorder Labs: No results found for: HGBA1C, MPG No results found for: PROLACTIN No results found for: CHOL, TRIG, HDL, CHOLHDL, VLDL, LDLCALC No results found for: TSH  Therapeutic Level Labs: No results found for: LITHIUM  No results found for: VALPROATE No results found for: CBMZ  Current Medications: Current  Outpatient Medications  Medication Sig Dispense Refill   ibuprofen (ADVIL) 200 MG tablet Take by mouth once.     lamoTRIgine  (LAMICTAL ) 100 MG tablet Take 1 tablet (100 mg total) by mouth 2 (two) times daily. 180 tablet 1   OLANZapine  (ZYPREXA ) 20 MG tablet Take 1 tablet (20 mg total) by mouth in the morning and at bedtime. 180 tablet 0   oxyCODONE-acetaminophen (PERCOCET/ROXICET) 5-325 MG tablet Take 1 tablet  by mouth every 6 (six) hours as needed.     No current facility-administered medications for this visit.     Musculoskeletal: Strength & Muscle Tone: within normal limits Gait & Station: normal Patient leans: N/A  Psychiatric Specialty Exam: Review of Systems  Blood pressure (!) 167/107, pulse 85, temperature (!) 97.4 F (36.3 C), temperature source Temporal, height 6' 2 (1.88 m), weight 224 lb 9.6 oz (101.9 kg).Body mass index is 28.84 kg/m.  General Appearance: Well Groomed  Eye Contact:  Good  Speech:  Clear and Coherent  Volume:  Normal  Mood:  depressed, but hopeful  Affect:  Appropriate, Congruent, and calm  Thought Process:  Coherent  Orientation:  Full (Time, Place, and Person)  Thought Content: Logical   Suicidal Thoughts:  No  Homicidal Thoughts:  No  Memory:  Immediate;   Good  Judgement:  Good  Insight:  Good  Psychomotor Activity:  Normal, Normal tone, no rigidity, no resting/postural tremors, no tardive dyskinesia    Concentration:  Concentration: Good and Attention Span: Good  Recall:  Good  Fund of Knowledge: Good  Language: Good  Akathisia:  No  Handed:  Right  AIMS (if indicated): 0   Assets:  Communication Skills Desire for Improvement  ADL's:  Intact  Cognition: WNL  Sleep:  Fair   Screenings: GAD-7    Loss Adjuster, Chartered Office Visit from 11/07/2023 in [facility] Office Visit from 12/27/2022 in [facility] Office Visit from 10/26/2022 in [facility]  Total GAD-7 Score 4 4 4    PHQ2-9    Flowsheet Row Office Visit from 11/07/2023 in [facility] Office Visit from 12/27/2022 in [facility] Office Visit from 10/26/2022 in [facility]  PHQ-2 Total Score 1 2 3   PHQ-9 Total Score -- 2 5     Assessment and Plan:  Rodney Morrison is a 54 year old male with a history of bipolar disorder vs depression, anxiety, who presents for the follow up for below.  1. Mood disorder in conditions classified elsewhere 2. Anxiety disorder, unspecified type He has chronic back pain. He has family history of mother with bipolar disorder, gambling, and reports conflict with her.  Acute stressors include: conflict with his mother  History:Tx from Dr. Kandyce under bipolar vs depression. Depressive symptoms since age 88 . He denies any manic symptoms. Had episode of delusion about radiation in 2019, and olanzapine  was uptitrated from 30 mg. Admitted to [facility] due to depression in the context of cocaine use. originally on Lamotrigine  100 mg twice a day olanzapine  40 mg daily.  He is not interested in CBT.  Although he reports depressed mood, he denies anxiety and his mood has been overall manageable since the previous visit.  He is hopeful for the upcoming ablation for his back pain.  Will maintain the current medication regimen given he reports significant benefit from the current combination.  Will continue lamotrigine  and olanzapine  for depression, mood dysregulation and anxiety, off-label. Noted that although there is a chart diagnosis of bipolar disorder, he denies any mania or hypomania in the past.  Will continue to assess  # hypertension He has hypertension on today's visit.  Etiology is multifactorial given his ongoing pain.  He agrees to reduce the caffeine intake to minimize his risk of hypertension.  He will  be followed up by his primary care provider.  3. High risk medication use He was advised to obtain EKG to monitor QTc prolongation.       Last checked  EKG HR , QTcmsec    Lipid panels LDL 130, Chol 208 01/2023  YaJ8r Glu 98 01/2023       Continue Lamotrigine  100 mg twice a day - [facility] Continue olanzapine  40 mg daily - monitor weight gain - [facility] Obtain EKG - please call [phone] to make an appointment  Next appointment- 4/7 at 3 pm, IP   The patient demonstrates the following risk factors for suicide: Chronic risk factors for suicide include: psychiatric disorder of depression and substance use disorder. Acute risk factors for suicide include: unemployment. Protective factors for this patient include: positive social support, responsibility to others (children, family), coping skills, and hope for the future. Considering these factors, the overall suicide risk at this point appears to be low. Patient is appropriate for outpatient follow up.     Collaboration of Care: Collaboration of Care: Other reviewed notes in Epic  Patient/Guardian was advised Release of Information must be obtained prior to any record release in order to collaborate their care with an outside provider. Patient/Guardian was advised if they have not already done so to contact the registration department to sign all necessary forms in order for us  to release information regarding their care.   Consent: Patient/Guardian gives verbal consent for treatment and assignment of benefits for services provided during this visit. Patient/Guardian expressed understanding and agreed to proceed.    Katheren Sleet, MD 01/23/2024, 3:27 PM     [1]  Allergies Allergen Reactions   Prednisone Other (See Comments)    Increased heart rate .

## 2024-01-23 ENCOUNTER — Ambulatory Visit: Admitting: Psychiatry

## 2024-01-23 ENCOUNTER — Encounter: Payer: Self-pay | Admitting: Psychiatry

## 2024-01-23 ENCOUNTER — Other Ambulatory Visit: Payer: Self-pay

## 2024-01-23 VITALS — BP 167/107 | HR 85 | Temp 97.4°F | Ht 74.0 in | Wt 224.6 lb

## 2024-01-23 DIAGNOSIS — F0630 Mood disorder due to known physiological condition, unspecified: Secondary | ICD-10-CM

## 2024-01-23 DIAGNOSIS — F419 Anxiety disorder, unspecified: Secondary | ICD-10-CM

## 2024-01-23 DIAGNOSIS — Z79899 Other long term (current) drug therapy: Secondary | ICD-10-CM | POA: Diagnosis not present

## 2024-01-23 MED ORDER — OLANZAPINE 20 MG PO TABS: 20.0000 mg | ORAL_TABLET | Freq: Two times a day (BID) | ORAL | 0 refills | Status: AC

## 2024-02-16 ENCOUNTER — Telehealth: Payer: Self-pay | Admitting: Orthopedic Surgery

## 2024-02-16 NOTE — Telephone Encounter (Signed)
 He had lumbar RFA with Chasnis on 01/25/24. Please call to check and see how he is doing. Thanks!

## 2024-04-17 ENCOUNTER — Ambulatory Visit: Admitting: Psychiatry
